# Patient Record
Sex: Female | Born: 1965 | Race: White | Hispanic: No | Marital: Single | Smoking: Current every day smoker
Health system: Southern US, Community
[De-identification: ages and names within clinical notes are randomized; demographics above are authoritative.]

## PROBLEM LIST (undated history)

## (undated) DIAGNOSIS — Z720 Tobacco use: Secondary | ICD-10-CM

## (undated) DIAGNOSIS — E7800 Pure hypercholesterolemia, unspecified: Secondary | ICD-10-CM

## (undated) DIAGNOSIS — D259 Leiomyoma of uterus, unspecified: Secondary | ICD-10-CM

## (undated) DIAGNOSIS — E785 Hyperlipidemia, unspecified: Secondary | ICD-10-CM

## (undated) DIAGNOSIS — E669 Obesity, unspecified: Secondary | ICD-10-CM

## (undated) DIAGNOSIS — R519 Headache, unspecified: Secondary | ICD-10-CM

## (undated) DIAGNOSIS — N83209 Unspecified ovarian cyst, unspecified side: Secondary | ICD-10-CM

## (undated) DIAGNOSIS — R51 Headache: Secondary | ICD-10-CM

## (undated) DIAGNOSIS — R0602 Shortness of breath: Secondary | ICD-10-CM

## (undated) DIAGNOSIS — L309 Dermatitis, unspecified: Secondary | ICD-10-CM

## (undated) DIAGNOSIS — K8020 Calculus of gallbladder without cholecystitis without obstruction: Secondary | ICD-10-CM

## (undated) HISTORY — DX: Headache, unspecified: R51.9

## (undated) HISTORY — DX: Headache: R51

## (undated) HISTORY — DX: Leiomyoma of uterus, unspecified: D25.9

## (undated) HISTORY — DX: Hyperlipidemia, unspecified: E78.5

## (undated) HISTORY — PX: OTHER SURGICAL HISTORY: SHX169

## (undated) HISTORY — DX: Tobacco use: Z72.0

## (undated) HISTORY — DX: Unspecified ovarian cyst, unspecified side: N83.209

## (undated) HISTORY — PX: CERVICAL CONE BIOPSY: SUR198

## (undated) HISTORY — DX: Obesity, unspecified: E66.9

---

## 2001-01-12 ENCOUNTER — Other Ambulatory Visit: Admission: RE | Admit: 2001-01-12 | Discharge: 2001-01-12 | Payer: Self-pay | Admitting: *Deleted

## 2002-02-02 ENCOUNTER — Other Ambulatory Visit: Admission: RE | Admit: 2002-02-02 | Discharge: 2002-02-02 | Payer: Self-pay | Admitting: *Deleted

## 2002-12-27 ENCOUNTER — Other Ambulatory Visit: Admission: RE | Admit: 2002-12-27 | Discharge: 2002-12-27 | Payer: Self-pay | Admitting: *Deleted

## 2004-02-12 ENCOUNTER — Other Ambulatory Visit: Admission: RE | Admit: 2004-02-12 | Discharge: 2004-02-12 | Payer: Self-pay | Admitting: Obstetrics and Gynecology

## 2005-03-17 ENCOUNTER — Other Ambulatory Visit: Admission: RE | Admit: 2005-03-17 | Discharge: 2005-03-17 | Payer: Self-pay | Admitting: Obstetrics and Gynecology

## 2007-06-08 ENCOUNTER — Encounter: Admission: RE | Admit: 2007-06-08 | Discharge: 2007-06-08 | Payer: Self-pay | Admitting: Obstetrics and Gynecology

## 2007-07-05 ENCOUNTER — Encounter: Admission: RE | Admit: 2007-07-05 | Discharge: 2007-07-05 | Payer: Self-pay | Admitting: Obstetrics and Gynecology

## 2008-01-18 ENCOUNTER — Encounter: Admission: RE | Admit: 2008-01-18 | Discharge: 2008-01-18 | Payer: Self-pay | Admitting: Obstetrics and Gynecology

## 2008-07-20 ENCOUNTER — Encounter: Admission: RE | Admit: 2008-07-20 | Discharge: 2008-07-20 | Payer: Self-pay | Admitting: Obstetrics and Gynecology

## 2009-07-30 ENCOUNTER — Encounter: Admission: RE | Admit: 2009-07-30 | Discharge: 2009-07-30 | Payer: Self-pay | Admitting: Obstetrics and Gynecology

## 2010-01-09 ENCOUNTER — Encounter: Payer: Self-pay | Admitting: Family Medicine

## 2010-01-09 LAB — CONVERTED CEMR LAB

## 2010-01-09 LAB — HM PAP SMEAR

## 2010-05-18 ENCOUNTER — Encounter: Payer: Self-pay | Admitting: Obstetrics and Gynecology

## 2010-06-20 ENCOUNTER — Encounter: Payer: Self-pay | Admitting: Family Medicine

## 2010-06-20 ENCOUNTER — Ambulatory Visit (INDEPENDENT_AMBULATORY_CARE_PROVIDER_SITE_OTHER): Payer: 59 | Admitting: Family Medicine

## 2010-06-20 DIAGNOSIS — F32A Depression, unspecified: Secondary | ICD-10-CM | POA: Insufficient documentation

## 2010-06-20 DIAGNOSIS — D509 Iron deficiency anemia, unspecified: Secondary | ICD-10-CM | POA: Insufficient documentation

## 2010-06-20 DIAGNOSIS — E559 Vitamin D deficiency, unspecified: Secondary | ICD-10-CM | POA: Insufficient documentation

## 2010-06-20 DIAGNOSIS — L0390 Cellulitis, unspecified: Secondary | ICD-10-CM

## 2010-06-20 DIAGNOSIS — L0291 Cutaneous abscess, unspecified: Secondary | ICD-10-CM | POA: Insufficient documentation

## 2010-06-20 DIAGNOSIS — F3289 Other specified depressive episodes: Secondary | ICD-10-CM

## 2010-06-20 DIAGNOSIS — E785 Hyperlipidemia, unspecified: Secondary | ICD-10-CM

## 2010-06-20 DIAGNOSIS — F329 Major depressive disorder, single episode, unspecified: Secondary | ICD-10-CM | POA: Insufficient documentation

## 2010-06-20 DIAGNOSIS — F17200 Nicotine dependence, unspecified, uncomplicated: Secondary | ICD-10-CM | POA: Insufficient documentation

## 2010-06-24 ENCOUNTER — Encounter: Payer: Self-pay | Admitting: Family Medicine

## 2010-06-24 NOTE — Assessment & Plan Note (Signed)
Summary: NEW PT TO EST/CLE  [employer],MAILED NPP   Vital Signs:  Patient profile:   45 year old female Height:      63 inches Weight:      219.50 pounds BMI:     39.02 Temp:     98.7 degrees F oral Pulse rate:   97 / minute Pulse rhythm:   regular BP sitting:   120 / 80  (right arm) Cuff size:   large  Vitals Entered By: Linde Gillis CMA Duncan Dull) (June 20, 2010 10:49 AM) CC: new patient, establish care   History of Present Illness: 45 yo here to establish care.  1.  Abscess on right neck- had been growing in size for several months.  Became very red and painful last month, per patient "it was squishy and the size of a quarter."  went to urgent care, given 10 day course of bactrim ds two times a day.  It is much smaller now, firm, non painful, non erythematous.  she currently has no fevers or chills.  2.  Depression- mom and dad live next door and both in failing health.  Her mom's parkinsons is now considered end stage.  [name] is having a hard time with this transition.  She is more tearful and sad.  sleeping ok, feels she eats too much.  No panic attacks.  3.  HLD- assessment at work showed elevated cholesterol.  not on medication.  Last assessment was 12/2009 and she was only given the total, not HDL/LDL/TG.  4.  Tobacco abuse- 1.5 ppd x 25 years.  quit once for a very short time, cold [place].  Not ready to quit but she works at [employer] and they are no longer aloud to smoke on campus so feels she has to quit.  Preventive Screening-Counseling & Management  Alcohol-Tobacco     Smoking Status: current  Caffeine-Diet-Exercise     Does Patient Exercise: no      Drug Use:  no.    Current Medications (verified): 1)  Sulfamethoxazole-Tmp Ds 800-160 Mg Tabs (Sulfamethoxazole-Trimethoprim) .... Take One Tablet By Mouth Two Times A Day For Ten Days 2)  Naproxen Sodium 550 Mg Tabs (Naproxen Sodium) .... Take One Tablet By Mouth Two Times A Day As Needed 3)  Multivitamins  Tabs (Multiple  Vitamin) .... Take One Tablet By Mouth Daily 4)  Folic Acid 400 Mcg Tabs (Folic Acid) .... Take One Tablet By Mouth Daily 5)  Flax Seed Oil 1000 Mg Caps (Flaxseed (Linseed)) .... Take One Tablet By Mouth Daily 6)  Calcium-Vitamin D 250-125 Mg-Unit Tabs (Calcium Carbonate-Vitamin D) .... Take One Tablet By Mouth Daily 7)  Zyban 150 Mg Xr12h-Tab (Bupropion Hcl (Smoking Deter)) .Marland KitchenMarland KitchenMarland Kitchen 150 Mg Once Daily For 3 Days; Increase To 150 Mg Twice Daily  Allergies (verified): No Known Drug Allergies  Past History:  Family History: Last updated: 06/20/2010 Family History Diabetes 1st degree relative Family History Hypertension  Social History: Last updated: 06/20/2010 Single Current Smoker Alcohol use-yes Drug use-no Regular exercise-no no children works at [employer]  Risk Factors: Exercise: no (06/20/2010)  Risk Factors: Smoking Status: current (06/20/2010)  Past Medical History: Depression Hyperlipidemia Tobacco abuse  Past History:  Care Management: OB/Gyn: Dr. Vincente Poli  Family History: Family History Diabetes 1st degree relative Family History Hypertension  Social History: Single Current Smoker Alcohol use-yes Drug use-no Regular exercise-no no children works at [employer] Smoking Status:  current Drug Use:  no Does Patient Exercise:  no  Review of Systems  See HPI General:  Denies fever. Eyes:  Denies blurring. ENT:  Denies difficulty swallowing. CV:  Denies chest pain or discomfort. Resp:  Denies shortness of breath. GI:  Denies abdominal pain and bloody stools. GU:  Denies dysuria. MS:  Denies joint pain, joint redness, and joint swelling. Derm:  Complains of lesion(s). Neuro:  Denies headaches. Psych:  Complains of depression, easily tearful, and irritability; denies anxiety, panic attacks, sense of great danger, suicidal thoughts/plans, and thoughts of violence. Endo:  Denies cold intolerance and heat intolerance. Heme:  Denies abnormal bruising and  bleeding.  Physical Exam  General:  alert and overweight-appearing.   Head:  normocephalic and atraumatic.   Eyes:  vision grossly intact, pupils equal, pupils round, and pupils reactive to light.   Ears:  R ear normal and L ear normal.   Nose:  no external deformity.   Mouth:  good dentition.   Neck:  dime dized, non flutuant, firm healing abscess on right neck, no erythema, non tender to palp. Lungs:  Normal respiratory effort, chest expands symmetrically. Lungs are clear to auscultation, no crackles or wheezes. Heart:  Normal rate and regular rhythm. S1 and S2 normal without gallop, murmur, click, rub or other extra sounds. Abdomen:  Bowel sounds positive,abdomen soft and non-tender without masses, organomegaly or hernias noted. Msk:  No deformity or scoliosis noted of thoracic or lumbar spine.   Extremities:  no edema Neurologic:  alert & oriented X3 and gait normal.   Skin:  Intact without suspicious lesions or rashes Psych:  Cognition and judgment appear intact. Alert and cooperative with normal attention span and concentration. No apparent delusions, illusions, hallucinations   Impression & Recommendations:  Problem # 1:  DEPRESSION (ICD-311) Assessment Deteriorated now worsened by home situation.   pt would like to quit smoking, discussed different antidepressants and she would like to try wellbutrin in hopes of also helping with her smoking.  Problem # 2:  TOBACCO ABUSE (ICD-305.1) Assessment: Unchanged see above. Her updated medication list for this problem includes:    Zyban 150 Mg Xr12h-tab (Bupropion hcl (smoking deter)) .Marland KitchenMarland KitchenMarland KitchenMarland Kitchen 150 mg once daily for 3 days; increase to 150 mg twice daily  Problem # 3:  ANEMIA, IRON DEFICIENCY (ICD-280.9) Assessment: Unchanged recheck CBC today. Her updated medication list for this problem includes:    Folic Acid 400 Mcg Tabs (Folic acid) .Marland Kitchen... Take one tablet by mouth daily  Orders: TLB-BMP (Basic Metabolic Panel-BMET)  (80048-METABOL) TLB-CBC Platelet - w/Differential (85025-CBCD)  Problem # 4:  HYPERLIPIDEMIA (ICD-272.4) Assessment: Unchanged fasting lipid panel today. Orders: Venipuncture (91478) TLB-Lipid Panel (80061-LIPID) TLB-Hepatic/Liver Function Pnl (80076-HEPATIC)  Problem # 5:  UNSPECIFIED VITAMIN D DEFICIENCY (ICD-268.9) Assessment: Unchanged recheck vitamin d today. Orders: T-Vitamin D (25-Hydroxy) [phone])  Problem # 6:  CELLULITIS AND ABSCESS OF UNSPECIFIED SITE (ICD-682.9) Assessment: New resolving. given rx for bactrim to fill if symptoms worsen and she will make appt to see me immediately if that were to happen. Her updated medication list for this problem includes:    Sulfamethoxazole-tmp Ds 800-160 Mg Tabs (Sulfamethoxazole-trimethoprim) .Marland Kitchen... Take one tablet by mouth two times a day for ten days  Complete Medication List: 1)  Sulfamethoxazole-tmp Ds 800-160 Mg Tabs (Sulfamethoxazole-trimethoprim) .... Take one tablet by mouth two times a day for ten days 2)  Naproxen Sodium 550 Mg Tabs (Naproxen sodium) .... Take one tablet by mouth two times a day as needed 3)  Multivitamins Tabs (Multiple vitamin) .... Take one tablet by mouth daily 4)  Folic Acid  400 Mcg Tabs (Folic acid) .... Take one tablet by mouth daily 5)  Flax Seed Oil 1000 Mg Caps (Flaxseed (linseed)) .... Take one tablet by mouth daily 6)  Calcium-vitamin D 250-125 Mg-unit Tabs (Calcium carbonate-vitamin d) .... Take one tablet by mouth daily 7)  Zyban 150 Mg Xr12h-tab (Bupropion hcl (smoking deter)) .Marland KitchenMarland KitchenMarland Kitchen 150 mg once daily for 3 days; increase to 150 mg twice daily  Patient Instructions: 1)  great to meet you. 2)  if that bump starts to grow again, please call me immediately and fill the prescription for the antibiotic. Prescriptions: SULFAMETHOXAZOLE-TMP DS 800-160 MG TABS (SULFAMETHOXAZOLE-TRIMETHOPRIM) take one tablet by mouth two times a day for ten days  #20 x 0   Entered and Authorized by:   Ruthe Mannan MD   Signed by:   Ruthe Mannan MD on 06/20/2010   Method used:   Print then Give to Patient   RxID:   [id number] ZYBAN 150 MG XR12H-TAB (BUPROPION HCL (SMOKING DETER)) 150 mg once daily for 3 days; increase to 150 mg twice daily  #60 x 3   Entered and Authorized by:   Ruthe Mannan MD   Signed by:   Ruthe Mannan MD on 06/20/2010   Method used:   Print then Give to Patient   RxID:   [id number]    Orders Added: 1)  Venipuncture [95284] 2)  TLB-Lipid Panel [80061-LIPID] 3)  TLB-BMP (Basic Metabolic Panel-BMET) [80048-METABOL] 4)  TLB-CBC Platelet - w/Differential [85025-CBCD] 5)  TLB-Hepatic/Liver Function Pnl [80076-HEPATIC] 6)  T-Vitamin D (25-Hydroxy) [13244-01027] 7)  New Patient Level IV [25366]    Current Allergies (reviewed today): No known allergies    TD Result Date:  08/10/2002 TD Result:  historical PAP Result Date:  01/09/2010 PAP Result:  historical Mammogram Result Date:  01/08/2010 Mammogram Result:  historical    Past Medical History:    Depression    Hyperlipidemia    Tobacco abuse

## 2010-06-30 ENCOUNTER — Encounter: Payer: Self-pay | Admitting: Family Medicine

## 2010-07-08 NOTE — Letter (Signed)
Summary: [facility] records  [facility] records   Imported By: Kassie Mends 07/03/2010 11:32:55  _____________________________________________________________________  External Attachment:    Type:   Image     Comment:   External Document

## 2010-07-10 ENCOUNTER — Other Ambulatory Visit: Payer: Self-pay | Admitting: Obstetrics and Gynecology

## 2010-07-10 DIAGNOSIS — Z1231 Encounter for screening mammogram for malignant neoplasm of breast: Secondary | ICD-10-CM

## 2010-07-24 NOTE — Letter (Signed)
Summary: [facility]  [facility]   Imported By: Kassie Mends 07/14/2010 10:19:28  _____________________________________________________________________  External Attachment:    Type:   Image     Comment:   External Document

## 2010-08-15 ENCOUNTER — Ambulatory Visit
Admission: RE | Admit: 2010-08-15 | Discharge: 2010-08-15 | Disposition: A | Discharge status: Home / Self Care | Payer: 59 | Source: Ambulatory Visit | Attending: Obstetrics and Gynecology | Admitting: Obstetrics and Gynecology

## 2010-08-15 DIAGNOSIS — Z1231 Encounter for screening mammogram for malignant neoplasm of breast: Secondary | ICD-10-CM

## 2010-08-25 ENCOUNTER — Other Ambulatory Visit: Payer: Self-pay | Admitting: Family Medicine

## 2010-08-25 DIAGNOSIS — E785 Hyperlipidemia, unspecified: Secondary | ICD-10-CM

## 2010-08-25 DIAGNOSIS — E559 Vitamin D deficiency, unspecified: Secondary | ICD-10-CM

## 2010-08-28 ENCOUNTER — Other Ambulatory Visit (INDEPENDENT_AMBULATORY_CARE_PROVIDER_SITE_OTHER): Payer: 59 | Admitting: Family Medicine

## 2010-08-28 DIAGNOSIS — E785 Hyperlipidemia, unspecified: Secondary | ICD-10-CM

## 2010-08-28 DIAGNOSIS — E559 Vitamin D deficiency, unspecified: Secondary | ICD-10-CM

## 2010-09-01 ENCOUNTER — Other Ambulatory Visit: Payer: Self-pay | Admitting: *Deleted

## 2010-09-01 MED ORDER — CALCIUM CITRATE-VITAMIN D 250-100 MG-UNIT PO TABS: 1.0000 | ORAL_TABLET | Freq: Every day | ORAL | Status: DC

## 2010-09-01 MED ORDER — MULTIVITAMINS PO CAPS: 1.0000 | ORAL_CAPSULE | Freq: Every day | ORAL | Status: AC

## 2010-09-01 MED ORDER — SIMVASTATIN 10 MG PO TABS: 10.0000 mg | ORAL_TABLET | Freq: Every day | ORAL | Status: DC

## 2010-09-01 MED ORDER — FLAX SEED OIL 1000 MG PO CAPS: 1.0000 | ORAL_CAPSULE | Freq: Every day | ORAL | Status: DC

## 2010-09-08 ENCOUNTER — Encounter: Payer: Self-pay | Admitting: Family Medicine

## 2010-09-10 ENCOUNTER — Other Ambulatory Visit: Payer: Self-pay | Admitting: *Deleted

## 2010-09-10 MED ORDER — SIMVASTATIN 10 MG PO TABS: 10.0000 mg | ORAL_TABLET | Freq: Every day | ORAL | Status: DC

## 2010-09-11 MED ORDER — BUPROPION HCL ER (SR) 150 MG PO TB12: 150.0000 mg | ORAL_TABLET | Freq: Two times a day (BID) | ORAL | Status: DC

## 2010-09-11 NOTE — Telephone Encounter (Signed)
Rx sent to [facility] electronically.

## 2010-09-18 ENCOUNTER — Encounter: Payer: Self-pay | Admitting: Family Medicine

## 2010-09-19 ENCOUNTER — Ambulatory Visit: Payer: 59 | Admitting: Family Medicine

## 2010-10-01 ENCOUNTER — Telehealth: Payer: Self-pay | Admitting: *Deleted

## 2010-10-01 NOTE — Telephone Encounter (Signed)
Pt states she is having a reaction to bupropion that she got from [facility]- she is getting a different generic from them and it is not as effective as the generic [facility] had [facility] before. She is staying angry all the time.   She has to deal with [facility] for insurance reasons.  Can we send in a new script for brand name only?  Please let pt know.

## 2010-10-01 NOTE — Telephone Encounter (Signed)
Yes ok to send rx 

## 2010-10-03 MED ORDER — BUPROPION HCL ER (SMOKING DET) 150 MG PO TB12: 150.0000 mg | ORAL_TABLET | Freq: Two times a day (BID) | ORAL | Status: DC

## 2010-10-03 NOTE — Telephone Encounter (Signed)
Rx sent to [facility], patient advised via message left on cell phone voicemail.

## 2010-10-07 ENCOUNTER — Telehealth: Payer: Self-pay | Admitting: *Deleted

## 2010-10-07 NOTE — Telephone Encounter (Signed)
Received fax form stating that Zyban SR is not covered under patients Rx drug plan.  We can chose an alternative or get prior authorization.  Form in your IN box.

## 2010-10-08 NOTE — Telephone Encounter (Signed)
Completed form faxed to [facility] at [phone]. 

## 2010-10-08 NOTE — Telephone Encounter (Signed)
Signed in my box

## 2010-10-30 ENCOUNTER — Telehealth: Payer: Self-pay | Admitting: *Deleted

## 2010-10-30 MED ORDER — BUPROPION HCL ER (SMOKING DET) 150 MG PO TB12: 150.0000 mg | ORAL_TABLET | Freq: Two times a day (BID) | ORAL | Status: DC

## 2010-10-30 NOTE — Telephone Encounter (Signed)
Rx sent 

## 2010-10-30 NOTE — Telephone Encounter (Signed)
Patient notified as instructed via telephone.

## 2010-10-30 NOTE — Telephone Encounter (Signed)
Pt would like brand name wellbutrin [facility] to [facility].  She says you have been working on getting her a particular generic brand but [facility] doesn't carry the one that she wants, so she wants brand name only.

## 2010-12-10 ENCOUNTER — Other Ambulatory Visit: Payer: Self-pay | Admitting: *Deleted

## 2010-12-10 MED ORDER — BUPROPION HCL ER (SR) 150 MG PO TB12: 150.0000 mg | ORAL_TABLET | Freq: Two times a day (BID) | ORAL | Status: DC

## 2010-12-10 NOTE — Telephone Encounter (Signed)
Last filled 11/11/10.

## 2010-12-11 NOTE — Telephone Encounter (Signed)
Quanitity on wellbutrin refill changed to #60 and called to [facility] [address]

## 2011-05-04 ENCOUNTER — Other Ambulatory Visit: Payer: Self-pay | Admitting: *Deleted

## 2011-05-04 MED ORDER — BUPROPION HCL ER (SR) 150 MG PO TB12: 150.0000 mg | ORAL_TABLET | Freq: Two times a day (BID) | ORAL | Status: DC

## 2011-05-04 NOTE — Telephone Encounter (Signed)
Last refill 03/26/2011.

## 2011-05-13 ENCOUNTER — Other Ambulatory Visit: Payer: Self-pay | Admitting: Internal Medicine

## 2011-08-11 ENCOUNTER — Other Ambulatory Visit: Payer: Self-pay | Admitting: Family Medicine

## 2011-08-11 ENCOUNTER — Encounter: Payer: Self-pay | Admitting: Family Medicine

## 2011-08-11 ENCOUNTER — Ambulatory Visit (INDEPENDENT_AMBULATORY_CARE_PROVIDER_SITE_OTHER): Payer: 59 | Admitting: Family Medicine

## 2011-08-11 VITALS — BP 106/62 | HR 88 | Temp 98.1°F | Wt 237.0 lb

## 2011-08-11 DIAGNOSIS — L723 Sebaceous cyst: Secondary | ICD-10-CM

## 2011-08-11 DIAGNOSIS — L089 Local infection of the skin and subcutaneous tissue, unspecified: Secondary | ICD-10-CM

## 2011-08-11 DIAGNOSIS — L729 Follicular cyst of the skin and subcutaneous tissue, unspecified: Secondary | ICD-10-CM | POA: Insufficient documentation

## 2011-08-11 MED ORDER — SULFAMETHOXAZOLE-TRIMETHOPRIM 800-160 MG PO TABS: 1.0000 | ORAL_TABLET | Freq: Two times a day (BID) | ORAL | Status: AC

## 2011-08-11 MED ORDER — PRAVASTATIN SODIUM 10 MG PO TABS: 40.0000 mg | ORAL_TABLET | Freq: Every day | ORAL | Status: DC

## 2011-08-11 NOTE — Progress Notes (Signed)
46 yo here for   Abscess on right face/neck- recurrent issue. Started to get larger and more painful with surrounding erythema few days ago. Has not yet applied warm compresses.  No fevers or chills. No n/v/d.  Patient Active Problem List  Diagnoses  . UNSPECIFIED VITAMIN D DEFICIENCY  . HYPERLIPIDEMIA  . ANEMIA, IRON DEFICIENCY  . TOBACCO ABUSE  . DEPRESSION  . CELLULITIS AND ABSCESS OF UNSPECIFIED SITE  . Infected cyst of skin   Past Medical History  Diagnosis Date  . Depression   . Hyperlipidemia   . Tobacco abuse    No past surgical history on file. History  Substance Use Topics  . Smoking status: Current Everyday Smoker  . Smokeless tobacco: Not on file  . Alcohol Use: Yes   No family history on file. No Known Allergies Current Outpatient Prescriptions on File Prior to Visit  Medication Sig Dispense Refill  . buPROPion (WELLBUTRIN SR) 150 MG 12 hr tablet Take 1 tablet (150 mg total) by mouth 2 (two) times daily.  120 tablet  3  . calcium-vitamin D (OSCAL) 250-125 MG-UNIT per tablet Take 1 tablet by mouth daily.        . Flaxseed, Linseed, (FLAX SEED OIL) 1000 MG CAPS Take 1 capsule (1,000 mg total) by mouth daily.  30 capsule  0  . folic acid (FOLVITE) 400 MCG tablet Take 400 mcg by mouth daily.        . Multiple Vitamin (MULTIVITAMIN) capsule Take 1 capsule by mouth daily.  30 capsule  0  . naproxen sodium (ANAPROX) 550 MG tablet Take 550 mg by mouth 2 (two) times daily as needed.        . pravastatin (PRAVACHOL) 10 MG tablet Take 4 tablets (40 mg total) by mouth daily.  90 tablet  3   The PMH, PSH, Social History, Family History, Medications, and allergies have been reviewed in [facility], and have been updated if relevant.  Review of Systems  See HPI  General: Denies fever.   Physical Exam  BP 106/62  Pulse 88  Temp(Src) 98.1 F (36.7 C) (Oral)  Wt 237 lb (107.502 kg)  General: alert and overweight-appearing.  Mouth: good dentition.  Skin: dime dized, non  flutuant, firm tender abscess on right side of face.   Psych: Cognition and judgment appear intact. Alert and cooperative with normal attention span and concentration. No apparent delusions, illusions, hallucinations   Assessment and Plan: 1. Infected cyst of skin  Ambulatory referral to General Surgery   Deteriorated. Will place on Bactrim and refer to surg for removal. The patient indicates understanding of these issues and agrees with the plan.

## 2011-08-11 NOTE — Patient Instructions (Signed)
Please take Bactrim as directed- 1 tablet by mouth twice daily x 10 days. Please start using warm compresses again. Stop by to see Shirlee Limerick on your way out to set up your appointment the surgeon.

## 2011-11-24 ENCOUNTER — Other Ambulatory Visit: Payer: Self-pay | Admitting: Obstetrics and Gynecology

## 2011-11-24 DIAGNOSIS — Z1231 Encounter for screening mammogram for malignant neoplasm of breast: Secondary | ICD-10-CM

## 2011-12-17 ENCOUNTER — Ambulatory Visit
Admission: RE | Admit: 2011-12-17 | Discharge: 2011-12-17 | Disposition: A | Discharge status: Home / Self Care | Payer: 59 | Source: Ambulatory Visit | Attending: Obstetrics and Gynecology | Admitting: Obstetrics and Gynecology

## 2011-12-17 DIAGNOSIS — Z1231 Encounter for screening mammogram for malignant neoplasm of breast: Secondary | ICD-10-CM

## 2011-12-21 ENCOUNTER — Other Ambulatory Visit: Payer: Self-pay | Admitting: Obstetrics and Gynecology

## 2011-12-21 DIAGNOSIS — R928 Other abnormal and inconclusive findings on diagnostic imaging of breast: Secondary | ICD-10-CM

## 2011-12-23 ENCOUNTER — Ambulatory Visit
Admission: RE | Admit: 2011-12-23 | Discharge: 2011-12-23 | Disposition: A | Discharge status: Home / Self Care | Payer: 59 | Source: Ambulatory Visit | Attending: Obstetrics and Gynecology | Admitting: Obstetrics and Gynecology

## 2011-12-23 DIAGNOSIS — R928 Other abnormal and inconclusive findings on diagnostic imaging of breast: Secondary | ICD-10-CM

## 2012-01-01 ENCOUNTER — Ambulatory Visit (INDEPENDENT_AMBULATORY_CARE_PROVIDER_SITE_OTHER): Payer: 59 | Admitting: Family Medicine

## 2012-01-01 ENCOUNTER — Encounter: Payer: Self-pay | Admitting: Family Medicine

## 2012-01-01 VITALS — BP 126/62 | HR 100 | Temp 98.1°F | Wt 237.0 lb

## 2012-01-01 DIAGNOSIS — R1110 Vomiting, unspecified: Secondary | ICD-10-CM

## 2012-01-01 MED ORDER — ONDANSETRON HCL 4 MG PO TABS: 4.0000 mg | ORAL_TABLET | Freq: Three times a day (TID) | ORAL | Status: AC | PRN

## 2012-01-01 NOTE — Patient Instructions (Addendum)
Drink sips of fluids and take zofran for nausea.  If you have aches, it is okay to take naproxen with food.  Out of work in meantime.  I think you have stomach virus. Wash your hands frequently.

## 2012-01-01 NOTE — Progress Notes (Signed)
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  She felt hot, sweaty and light headed.  Went to BR and vomited, had diarrhea.  Then had chills.  Since last night, has had HA.  Took some naproxen and tylenol this AM with relief of HA.  Aches are resolved in interval.  No fevers known/documented.  No blood in stool or vomit.  Her taste is altered from baseline.  Was able to eat some soup last night.    Meds, vitals, and allergies reviewed.   ROS: See HPI.  Otherwise, noncontributory.  GEN: nad, alert and oriented HEENT: mucous membranes moist, TM wnl, nasal and OP exam wnl NECK: supple w/o LA CV: rrr. PULM: ctab, no inc wob ABD: soft, +bs, not ttp EXT: no edema SKIN: no acute rash

## 2012-01-03 DIAGNOSIS — R1110 Vomiting, unspecified: Secondary | ICD-10-CM | POA: Insufficient documentation

## 2012-01-03 NOTE — Assessment & Plan Note (Signed)
Likely AGE with  benign exam.  Nontoxic, doesn't appear dehydrated and okay for outpatient f/u with supportive tx in meantime.  She agrees.

## 2012-01-04 ENCOUNTER — Telehealth: Payer: Self-pay | Admitting: Family Medicine

## 2012-01-04 NOTE — Telephone Encounter (Signed)
Triage Record Num: [id number] Operator: Lyn Hollingshead Patient Name: [name] Call Date & Time: 01/02/2012 12:00:58PM Patient Phone: [phone] PCP: Crawford Givens Patient Gender: Female PCP Fax : Patient DOB: [birth year] [facility] Name: [facility] Reason for Call: Caller: Pam/Patient; PCP: Crawford Givens Clelia Croft) [facility]); CB#: [id number]; Call regarding Was told to let Dr Know of any changes and now she has lost control of bladder,. ; Onset- 01/02/12 Pt has not taken her temperature but does not think she has fever. Recommended she take temperature. Pt is having urinary incontinence all of sudden starting yesterday evening. She states every time she walks, turns over in bed, etc. urine comes out. She also has some low back pain. She can also see pink on the tissue with wiping. She is not sure if it is coming from her urine or her vagina. Emergent s/s of Urinary symptoms protocol r/o. Pt to see provider within 4hrs. Protocol(s) Used: Urinary Symptoms - Female Recommended Outcome per Protocol: See Provider within 4 hours Reason for Outcome: Urinary tract symptoms AND any flank or low back pain Care Advice: Increase intake of fluids. Try to drink 8 oz. (.2 liter) every hour when awake, including unsweetened cranberry juice, unless on restricted fluids for other medical reasons. Take sips of fluid or eat ice chips if nauseated or vomiting. ~ Limit carbonated, alcoholic, and caffeinated beverages such as coffee, tea and soda. Avoid nonprescription cold and allergy medications that contain caffeine. Limit intake of tomatoes, fruit juices (except for unsweetened cranberry juice), dairy products, spicy foods, sugar, and artificial sweeteners (aspartame or saccharine). Stop or decrease smoking. Reducing exposure to bladder irritants may help lessen urgency. ~ 01/02/2012 12:18:54PM Page 1 of 1 [id number]

## 2012-01-05 NOTE — Telephone Encounter (Signed)
Called patient, she saw gyn yesterday and has developed several fibroids- one is almost grapefruit size- and she had a watery cyst in her fallopian tube that had burst, which is where the fluid was coming from.  Gyn put her on antibiotic for 14 days and then she is to go back for repeat ultrasound.

## 2012-01-05 NOTE — Telephone Encounter (Signed)
Advised pt to call if she needs anything.

## 2012-01-05 NOTE — Telephone Encounter (Signed)
Thanks for the update.  Please continue to keep [place] posted.

## 2012-01-07 ENCOUNTER — Encounter: Payer: Self-pay | Admitting: *Deleted

## 2012-01-07 ENCOUNTER — Telehealth: Payer: Self-pay

## 2012-01-07 NOTE — Telephone Encounter (Signed)
Pt seen 01/03/12; pt returned to work today and needs note from doctor OK to return to work; pt has no h/a, N or V, diarrhea or dizziness.Please advise.

## 2012-01-07 NOTE — Telephone Encounter (Signed)
Patient requested that note be faxed to [phone] to her at her work place.  Faxed.

## 2012-01-07 NOTE — Telephone Encounter (Signed)
Please give her a note to return to work.

## 2012-01-07 NOTE — Telephone Encounter (Signed)
Printed.  Patient advised.

## 2012-01-21 ENCOUNTER — Encounter: Payer: Self-pay | Admitting: Gastroenterology

## 2012-02-03 ENCOUNTER — Ambulatory Visit (INDEPENDENT_AMBULATORY_CARE_PROVIDER_SITE_OTHER): Payer: 59 | Admitting: Gastroenterology

## 2012-02-03 ENCOUNTER — Encounter: Payer: Self-pay | Admitting: Gastroenterology

## 2012-02-03 VITALS — BP 110/64 | HR 88 | Ht 62.25 in | Wt 237.5 lb

## 2012-02-03 DIAGNOSIS — R110 Nausea: Secondary | ICD-10-CM

## 2012-02-03 DIAGNOSIS — R194 Change in bowel habit: Secondary | ICD-10-CM

## 2012-02-03 DIAGNOSIS — K5900 Constipation, unspecified: Secondary | ICD-10-CM

## 2012-02-03 DIAGNOSIS — K5909 Other constipation: Secondary | ICD-10-CM

## 2012-02-03 DIAGNOSIS — R198 Other specified symptoms and signs involving the digestive system and abdomen: Secondary | ICD-10-CM

## 2012-02-03 MED ORDER — OMEPRAZOLE 40 MG PO CPDR: 20.0000 mg | DELAYED_RELEASE_CAPSULE | Freq: Every day | ORAL | Status: DC

## 2012-02-03 MED ORDER — PEG-KCL-NACL-NASULF-NA ASC-C 100 G PO SOLR: 1.0000 | Freq: Once | ORAL | Status: DC

## 2012-02-03 NOTE — Patient Instructions (Addendum)
One of your biggest health concerns is your smoking.  This increases your risk for most cancers and serious cardiovascular diseases such as strokes, heart attacks.  You should try your best to stop.  If you need assistance, please contact your PCP or Smoking Cessation Class at [facility] [phone]) or [facility] Quit-Line (1-800-QUIT-NOW). You will be set up for an upper endoscopy for post prandial pains, nausea. If this is negative, then [place] of gallbladder. You will be set up for a colonoscopy for recent bowel changes, chronic constipation (lec, moderate sedation). Samples of PPI given, antiacid medcine.  Take one pill, once daily 20-30 minutes before breakfast meal. A copy of this information will be made available to Dr. Vincente Poli.

## 2012-02-03 NOTE — Progress Notes (Signed)
HPI: This is a     very pleasant 46 year old woman whom I am meeting for the first time today.  Has been having hot flashes (very warm on face, arms).  Headaches, nausea, back aches, pin-needles, light headed.  Has intermittent nausea with hot flashes only.  This started in August, 3 times in September, 3-4 minor ones more recently.  She can only eat 3-6 bites and then has sharp pressure in epigastrium.  This occurs with really any foods but she avoided greasy fooods, spicey foods.  Can only eat small meals, will be uncomfortable for several hours.  Has not been losing weight at all.  Takes napraoxen during menstral cycle (4 times a month).  Does not take daily. Will take alleve as well.  Takes tylenol occasionally.  Gets pyrosis.  Has not tried any antiacid meds.  Bowel are "back to normal," usually a bit constipated. Never sees blood in her stools.  She did have very loose runny stools in past few months  Has been on abx recently for unclear infection, cipro; "her uterus was very swollen, tubes inflammed"  Never had EGD or colonoscopy.  No colon  Cancer in family.    Review of systems: Pertinent positive and negative review of systems were noted in the above HPI section. Complete review of systems was performed and was otherwise normal.    Past Medical History  Diagnosis Date  . Depression     pt denies  . Hyperlipidemia   . Tobacco abuse   . Generalized headaches   . Skin cancer   . Obesity     Past Surgical History  Procedure Date  . Endometrial surgery     Current Outpatient Prescriptions  Medication Sig Dispense Refill  . calcium-vitamin D (OSCAL) 250-125 MG-UNIT per tablet Take 1 tablet by mouth daily.        . Multiple Vitamins-Minerals (MULTIVITAMIN PO) Take 1 tablet by mouth daily.      . naproxen sodium (ANAPROX) 550 MG tablet Take 550 mg by mouth 2 (two) times daily as needed.        Marland Kitchen buPROPion (WELLBUTRIN SR) 150 MG 12 hr tablet Take 150 mg by mouth daily.        . Flaxseed, Linseed, (FLAX SEED OIL) 1000 MG CAPS Take 1 capsule (1,000 mg total) by mouth daily.  30 capsule  0  . folic acid (FOLVITE) 400 MCG tablet Take 400 mcg by mouth daily.        . pravastatin (PRAVACHOL) 10 MG tablet Take 10 mg by mouth daily.        Allergies as of 02/03/2012  . (No Known Allergies)    Family History  Problem Relation Age of Onset  . Heart disease Father   . Arthritis Mother   . Diabetes Father   . Skin cancer Father   . Diabetes Mother   . Diabetes Paternal Grandmother     History   Social History  . Marital Status: Single    Spouse Name: N/A    Number of Children: 0  . Years of Education: N/A   Occupational History  . clerical [employer]   Social History Main Topics  . Smoking status: Current Every Day Smoker -- 1.0 packs/day for 30 years    Types: Cigarettes  . Smokeless tobacco: Never Used  . Alcohol Use: Yes     1 beer once a year  . Drug Use: No  . Sexually Active: Not on file   Other  Topics Concern  . Not on file   Social History Narrative  . No narrative on file       Physical Exam: BP 110/64  Pulse 88  Ht 5' 2.25" (1.581 m)  Wt 237 lb 8 oz (107.729 kg)  BMI 43.09 kg/m2  LMP 02/01/2012 Constitutional: generally well-appearing Psychiatric: alert and oriented x3 Eyes: extraocular movements intact Mouth: oral pharynx moist, no lesions Neck: supple no lymphadenopathy Cardiovascular: heart regular rate and rhythm Lungs: clear to auscultation bilaterally Abdomen: soft, nontender, nondistended, no obvious ascites, no peritoneal signs, normal bowel sounds Extremities: no lower extremity edema bilaterally Skin: no lesions on visible extremities    Assessment and plan: 46 y.o. female with  postprandial epigastric pain, intermittent GERD, chronic constipation, recent change in bowel habits.  Her upper GI symptoms may be GERD related, perhaps due to some of her and said use although it is not very extreme. I am giving  her samples of an antiacid medicine we will proceed with EGD at her soonest convenience. If the EGD is unrevealing then I will turn attention to her gallbladder by ordering an abdominal ultrasound. She is certain the right demographic patient to get gallstones, gallstone disease. She has had recent changes in her bowels although they're improving, she still has her usual constipation. We'll proceed with colonoscopy at the same time as her upper endoscopy.

## 2012-02-22 ENCOUNTER — Ambulatory Visit (AMBULATORY_SURGERY_CENTER): Payer: 59 | Admitting: Gastroenterology

## 2012-02-22 ENCOUNTER — Encounter: Payer: Self-pay | Admitting: Gastroenterology

## 2012-02-22 VITALS — BP 124/84 | HR 92 | Temp 98.8°F | Resp 14 | Ht 62.0 in | Wt 237.0 lb

## 2012-02-22 DIAGNOSIS — K2960 Other gastritis without bleeding: Secondary | ICD-10-CM

## 2012-02-22 DIAGNOSIS — R198 Other specified symptoms and signs involving the digestive system and abdomen: Secondary | ICD-10-CM

## 2012-02-22 DIAGNOSIS — R110 Nausea: Secondary | ICD-10-CM

## 2012-02-22 DIAGNOSIS — K5900 Constipation, unspecified: Secondary | ICD-10-CM

## 2012-02-22 MED ORDER — SODIUM CHLORIDE 0.9 % IV SOLN: 500.0000 mL | INTRAVENOUS | Status: DC

## 2012-02-22 MED ORDER — FLEET ENEMA 7-19 GM/118ML RE ENEM
1.0000 | ENEMA | Freq: Once | RECTAL | Status: AC
  Administered 2012-02-22: 1 via RECTAL

## 2012-02-22 NOTE — Op Note (Signed)
[facility] [address] [place], [address]   ENDOSCOPY PROCEDURE REPORT  PATIENT: Jade, Zuniga  MR#: [id number] BIRTHDATE: 29-Apr-1965 , 46  yrs. old GENDER: Female ENDOSCOPIST: Rachael Fee, MD PROCEDURE DATE:  02/22/2012 PROCEDURE:  EGD w/ biopsy ASA CLASS:     Class III INDICATIONS:  post prandial epig pain, GERD. MEDICATIONS: There was residual sedation effect present from prior procedure, Fentanyl 25 mcg IV, Versed 2 mg IV, and These medications were titrated to patient response per physician's verbal order TOPICAL ANESTHETIC: Cetacaine Spray  DESCRIPTION OF PROCEDURE: After the risks benefits and alternatives of the procedure were thoroughly explained, informed consent was obtained.  The LB [id number]-H180 [id number] endoscope was introduced through the mouth and advanced to the second portion of the duodenum. Without limitations.  The instrument was slowly withdrawn as the mucosa was fully examined.  There was mild, non-specific gastritis.  This was biopsied and sent to pathology.  The examination was otherwise normal.  Retroflexed views revealed no abnormalities.     The scope was then withdrawn from the patient and the procedure completed. COMPLICATIONS: There were no complications.  ENDOSCOPIC IMPRESSION: There was mild, non-specific gastritis.  This was biopsied and sent to pathology. The examination was otherwise normal.  RECOMMENDATIONS: If biopsies show H.  pylori, she will be treated with appropriate antibiotics.  If not, then will turn attention to gallbladder as possible cause of post prandial pains.    eSigned:  Rachael Fee, MD 02/22/2012 3:22 PM   CC: Ruthe Mannan, MD

## 2012-02-22 NOTE — Progress Notes (Signed)
Pt reports stools are still cloudy brown with particulate matter. Dr. Christella Hartigan ordered a fleets enema. 1 administered by patient to self, observed results clear with a small amount of yellow particulate matter. Dr. Christella Hartigan aware and will proceed with colonoscopy.

## 2012-02-22 NOTE — Progress Notes (Signed)
Patient did not experience any of the following events: a burn prior to discharge; a fall within the facility; wrong site/side/patient/procedure/implant event; or a hospital transfer or hospital admission upon discharge from the facility. (G8907) Patient did not have preoperative order for IV antibiotic SSI prophylaxis. (G8918)  

## 2012-02-22 NOTE — Patient Instructions (Addendum)
One of your biggest health concerns is your smoking.  This increases your risk for most cancers and serious cardiovascular diseases such as strokes, heart attacks.  You should try your best to stop.  If you need assistance, please contact your PCP or Smoking Cessation Class at [facility] [phone]) or [facility] Quit-Line (1-800-QUIT-NOW). Discharge instructions given with verbal understanding. Handout on gastritis given. Resume previous medications. YOU HAD AN ENDOSCOPIC PROCEDURE TODAY AT THE  ENDOSCOPY CENTER: Refer to the procedure report that was given to you for any specific questions about what was found during the examination.  If the procedure report does not answer your questions, please call your gastroenterologist to clarify.  If you requested that your care partner not be given the details of your procedure findings, then the procedure report has been included in a sealed envelope for you to review at your convenience later.  YOU SHOULD EXPECT: Some feelings of bloating in the abdomen. Passage of more gas than usual.  Walking can help get rid of the air that was put into your GI tract during the procedure and reduce the bloating. If you had a lower endoscopy (such as a colonoscopy or flexible sigmoidoscopy) you may notice spotting of blood in your stool or on the toilet paper. If you underwent a bowel prep for your procedure, then you may not have a normal bowel movement for a few days.  DIET: Your first meal following the procedure should be a light meal and then it is ok to progress to your normal diet.  A half-sandwich or bowl of soup is an example of a good first meal.  Heavy or fried foods are harder to digest and may make you feel nauseous or bloated.  Likewise meals heavy in dairy and vegetables can cause extra gas to form and this can also increase the bloating.  Drink plenty of fluids but you should avoid alcoholic beverages for 24 hours.  ACTIVITY: Your care partner  should take you home directly after the procedure.  You should plan to take it easy, moving slowly for the rest of the day.  You can resume normal activity the day after the procedure however you should NOT DRIVE or use heavy machinery for 24 hours (because of the sedation medicines used during the test).    SYMPTOMS TO REPORT IMMEDIATELY: A gastroenterologist can be reached at any hour.  During normal business hours, 8:30 AM to 5:00 PM Monday through Friday, call [phone].  After hours and on weekends, please call the GI answering service at [phone] who will take a message and have the physician on call contact you.   Following lower endoscopy (colonoscopy or flexible sigmoidoscopy):  Excessive amounts of blood in the stool  Significant tenderness or worsening of abdominal pains  Swelling of the abdomen that is new, acute  Fever of 100F or higher  Following upper endoscopy (EGD)  Vomiting of blood or coffee ground material  New chest pain or pain under the shoulder blades  Painful or persistently difficult swallowing  New shortness of breath  Fever of 100F or higher  Black, tarry-looking stools  FOLLOW UP: If any biopsies were taken you will be contacted by phone or by letter within the next 1-3 weeks.  Call your gastroenterologist if you have not heard about the biopsies in 3 weeks.  Our staff will call the home number listed on your records the next business day following your procedure to check on you and address any  questions or concerns that you may have at that time regarding the information given to you following your procedure. This is a courtesy call and so if there is no answer at the home number and we have not heard from you through the emergency physician on call, we will assume that you have returned to your regular daily activities without incident.  SIGNATURES/CONFIDENTIALITY: You and/or your care partner have signed paperwork which will be entered into your  electronic medical record.  These signatures attest to the fact that that the information above on your After Visit Summary has been reviewed and is understood.  Full responsibility of the confidentiality of this discharge information lies with you and/or your care-partner.

## 2012-02-22 NOTE — Op Note (Signed)
[facility] [address] [place], [address]   COLONOSCOPY PROCEDURE REPORT  PATIENT: Jade, Zuniga  MR#: [id number] BIRTHDATE: [birth year] , 46  yrs. old GENDER: Female ENDOSCOPIST: Rachael Fee, MD REFERRED XB:JYNWG Aron, M.D. PROCEDURE DATE:  02/22/2012 PROCEDURE:   Colonoscopy, diagnostic ASA CLASS:   Class II INDICATIONS:constipation. MEDICATIONS: Fentanyl 75 mcg IV, Versed 8 mg IV, and These medications were titrated to patient response per physician's verbal order  DESCRIPTION OF PROCEDURE:   After the risks benefits and alternatives of the procedure were thoroughly explained, informed consent was obtained.  A digital rectal exam revealed no abnormalities of the rectum.   The LB [id number]-[id number]  endoscope was introduced through the anus and advanced to the cecum, which was identified by both the appendix and ileocecal valve. No adverse events experienced.   The quality of the prep was adequate, using MoviPrep  The instrument was then slowly withdrawn as the colon was fully examined.   COLON FINDINGS: A normal appearing cecum, ileocecal valve, and appendiceal orifice were identified.  The ascending, hepatic flexure, transverse, splenic flexure, descending, sigmoid colon and rectum appeared unremarkable.  No polyps or cancers were seen. Retroflexed views revealed no abnormalities. The time to cecum=5 minutes 12 seconds.  Withdrawal time=9 minutes 20 seconds.  The scope was withdrawn and the procedure completed. COMPLICATIONS: There were no complications.  ENDOSCOPIC IMPRESSION: Normal colon No polyps or cancers  RECOMMENDATIONS: You should continue to follow colorectal cancer screening guidelines for "routine risk" patients with a repeat colonoscopy in 10 years. There is no need for FOBT (stool) testing for at least 5 years.    eSigned:  Rachael Fee, MD 02/22/2012 3:10 PM

## 2012-02-23 ENCOUNTER — Telehealth: Payer: Self-pay | Admitting: *Deleted

## 2012-02-23 NOTE — Telephone Encounter (Signed)
  Follow up Call-  Call back number 02/22/2012  Post procedure Call Back phone  # [phone]  Permission to leave phone message Yes     Patient questions:  Do you have a fever, pain , or abdominal swelling? no Pain Score  0 *  Have you tolerated food without any problems? yes  Have you been able to return to your normal activities? yes  Do you have any questions about your discharge instructions: Diet   no Medications  no Follow up visit  no  Do you have questions or concerns about your Care? no  Actions: * If pain score is 4 or above: No action needed, pain <4.

## 2012-02-24 ENCOUNTER — Telehealth: Payer: Self-pay | Admitting: Gastroenterology

## 2012-02-24 NOTE — Telephone Encounter (Signed)
Pt notified that Dr Christella Hartigan is waiting for her pathology and if negative he will then focus on her gallbladder,  I did advise pt I will call her as soon as results are available

## 2012-02-29 ENCOUNTER — Other Ambulatory Visit: Payer: Self-pay

## 2012-02-29 DIAGNOSIS — R1013 Epigastric pain: Secondary | ICD-10-CM

## 2012-02-29 NOTE — Progress Notes (Signed)
Pt has been scheduled for an [place] on 03/02/12 10 am [facility] pt has been instructed and meds reviewed

## 2012-03-02 ENCOUNTER — Ambulatory Visit (HOSPITAL_COMMUNITY)
Admission: RE | Admit: 2012-03-02 | Discharge: 2012-03-02 | Disposition: A | Discharge status: Home / Self Care | Payer: 59 | Source: Ambulatory Visit | Attending: Gastroenterology | Admitting: Gastroenterology

## 2012-03-02 DIAGNOSIS — R1013 Epigastric pain: Secondary | ICD-10-CM | POA: Insufficient documentation

## 2012-03-03 ENCOUNTER — Encounter (HOSPITAL_COMMUNITY): Payer: Self-pay | Admitting: *Deleted

## 2012-03-03 ENCOUNTER — Emergency Department (HOSPITAL_COMMUNITY)
Admission: EM | Admit: 2012-03-03 | Discharge: 2012-03-04 | Disposition: A | Discharge status: Home / Self Care | Payer: 59 | Attending: Emergency Medicine | Admitting: Emergency Medicine

## 2012-03-03 DIAGNOSIS — F329 Major depressive disorder, single episode, unspecified: Secondary | ICD-10-CM | POA: Insufficient documentation

## 2012-03-03 DIAGNOSIS — F3289 Other specified depressive episodes: Secondary | ICD-10-CM | POA: Insufficient documentation

## 2012-03-03 DIAGNOSIS — E669 Obesity, unspecified: Secondary | ICD-10-CM | POA: Insufficient documentation

## 2012-03-03 DIAGNOSIS — Z7982 Long term (current) use of aspirin: Secondary | ICD-10-CM | POA: Insufficient documentation

## 2012-03-03 DIAGNOSIS — R0602 Shortness of breath: Secondary | ICD-10-CM | POA: Insufficient documentation

## 2012-03-03 DIAGNOSIS — R079 Chest pain, unspecified: Secondary | ICD-10-CM

## 2012-03-03 DIAGNOSIS — R42 Dizziness and giddiness: Secondary | ICD-10-CM | POA: Insufficient documentation

## 2012-03-03 DIAGNOSIS — R072 Precordial pain: Secondary | ICD-10-CM | POA: Insufficient documentation

## 2012-03-03 DIAGNOSIS — E785 Hyperlipidemia, unspecified: Secondary | ICD-10-CM | POA: Insufficient documentation

## 2012-03-03 DIAGNOSIS — R110 Nausea: Secondary | ICD-10-CM | POA: Insufficient documentation

## 2012-03-03 DIAGNOSIS — Z8742 Personal history of other diseases of the female genital tract: Secondary | ICD-10-CM | POA: Insufficient documentation

## 2012-03-03 DIAGNOSIS — F17200 Nicotine dependence, unspecified, uncomplicated: Secondary | ICD-10-CM | POA: Insufficient documentation

## 2012-03-03 DIAGNOSIS — E7800 Pure hypercholesterolemia, unspecified: Secondary | ICD-10-CM | POA: Insufficient documentation

## 2012-03-03 HISTORY — DX: Pure hypercholesterolemia, unspecified: E78.00

## 2012-03-03 LAB — URINALYSIS, MICROSCOPIC ONLY
Bilirubin Urine: NEGATIVE
Ketones, ur: NEGATIVE mg/dL
Nitrite: NEGATIVE
Urobilinogen, UA: 1 mg/dL (ref 0.0–1.0)

## 2012-03-03 LAB — COMPREHENSIVE METABOLIC PANEL
Alkaline Phosphatase: 86 U/L (ref 39–117)
BUN: 15 mg/dL (ref 6–23)
Creatinine, Ser: 0.68 mg/dL (ref 0.50–1.10)
GFR calc Af Amer: >90 mL/min (ref >90)
Glucose, Bld: 160 mg/dL — ABNORMAL HIGH (ref 70–99)
Potassium: 3.9 mEq/L (ref 3.5–5.1)
Total Bilirubin: 0.2 mg/dL — ABNORMAL LOW (ref 0.3–1.2)
Total Protein: 7.1 g/dL (ref 6.0–8.3)

## 2012-03-03 LAB — CBC WITH DIFFERENTIAL/PLATELET
Eosinophils Absolute: 0 10*3/uL (ref 0.0–0.7)
HCT: 47 % — ABNORMAL HIGH (ref 36.0–46.0)
Hemoglobin: 15.7 g/dL — ABNORMAL HIGH (ref 12.0–15.0)
Lymphs Abs: 1.5 10*3/uL (ref 0.7–4.0)
MCH: 30.3 pg (ref 26.0–34.0)
MCV: 90.6 fL (ref 78.0–100.0)
Monocytes Absolute: 0.9 10*3/uL (ref 0.1–1.0)
Monocytes Relative: 4 % (ref 3–12)
Neutrophils Relative %: 88 % — ABNORMAL HIGH (ref 43–77)
RBC: 5.19 MIL/uL — ABNORMAL HIGH (ref 3.87–5.11)

## 2012-03-03 LAB — LIPASE, BLOOD: Lipase: 30 U/L (ref 11–59)

## 2012-03-03 LAB — POCT PREGNANCY, URINE: Preg Test, Ur: NEGATIVE

## 2012-03-03 MED ORDER — ASPIRIN 81 MG PO CHEW
324.0000 mg | CHEWABLE_TABLET | Freq: Once | ORAL | Status: AC
  Administered 2012-03-04: 324 mg via ORAL
  Filled 2012-03-03: qty 4

## 2012-03-03 MED ORDER — PANTOPRAZOLE SODIUM 40 MG IV SOLR
40.0000 mg | Freq: Once | INTRAVENOUS | Status: AC
  Administered 2012-03-04: 40 mg via INTRAVENOUS
  Filled 2012-03-03: qty 40

## 2012-03-03 MED ORDER — FAMOTIDINE 20 MG PO TABS
20.0000 mg | ORAL_TABLET | Freq: Once | ORAL | Status: AC
  Administered 2012-03-04: 20 mg via ORAL
  Filled 2012-03-03: qty 1

## 2012-03-03 MED ORDER — GI COCKTAIL ~~LOC~~
30.0000 mL | Freq: Once | ORAL | Status: AC
  Administered 2012-03-04: 30 mL via ORAL
  Filled 2012-03-03: qty 30

## 2012-03-03 NOTE — ED Provider Notes (Signed)
History     CSN: [id number]  Arrival date & time 03/03/12  2039   None     Chief Complaint  Patient presents with  . Abdominal Pain    (Consider location/radiation/quality/duration/timing/severity/associated sxs/prior treatment) HPI History provided by pt.   Pt has had intermittent episodes of non-pleuritic, throbbing substernal CP for the past several months.  Had a typical episode this evening while lying on couch at 6:30pm.  First she gets a hot flash and becomes clammy, then develops constant CP that radiates through to her back w/ associated SOB, nausea and lightheadedness.  These sx are never exertional and always seem to start after eating.  Has been evaluated by GI, had an unimpressive endoscopy, and then had an [place] to evaluate her gallbladder yesterday.  Per prior chart, US showed possible biliary sludge.  Pertinent PMH includes hyperlipidemia and smoking.  Father had an MI at age 31.  Currently has mild substernal pressure and becomes lightheaded if she stands.  Has taken 1 po aspirin.    Past Medical History  Diagnosis Date  . Depression     pt denies  . Hyperlipidemia   . Tobacco abuse   . Generalized headaches   . Obesity   . Uterine fibroid   . Ovarian cyst   . Hypercholesteremia     Past Surgical History  Procedure Date  . Endometrial surgery     Family History  Problem Relation Age of Onset  . Heart disease Father   . Diabetes Father   . Skin cancer Father   . Arthritis Mother   . Diabetes Mother   . Diabetes Paternal Grandmother   . Colon cancer Neg Hx   . Esophageal cancer Neg Hx   . Stomach cancer Neg Hx   . Rectal cancer Neg Hx     History  Substance Use Topics  . Smoking status: Current Every Day Smoker -- 1.0 packs/day for 30 years    Types: Cigarettes  . Smokeless tobacco: Never Used  . Alcohol Use: Yes     Comment: 1 beer once a year    OB History    Grav Para Term Preterm Abortions TAB SAB Ect Mult Living                  Review  of Systems  All other systems reviewed and are negative.    Allergies  Review of patient's allergies indicates no known allergies.  Home Medications   Current Outpatient Rx  Name  Route  Sig  Dispense  Refill  . ASPIRIN 325 MG PO TABS   Oral   Take 325 mg by mouth daily.           BP 137/65  Pulse 121  Temp 98.7 F (37.1 C) (Oral)  Resp 20  SpO2 95%  LMP 02/01/2012  Physical Exam  Nursing note and vitals reviewed. Constitutional: She is oriented to person, place, and time. She appears well-developed and well-nourished. No distress.  HENT:  Head: Normocephalic and atraumatic.  Eyes:       Normal appearance  Neck: Normal range of motion.  Cardiovascular: Normal rate, regular rhythm and intact distal pulses.   Pulmonary/Chest: Effort normal and breath sounds normal. No respiratory distress.       No pleuritic pain reported.  Mild tenderness of sternum  Abdominal: Soft. Bowel sounds are normal. She exhibits no distension. There is no tenderness. There is no guarding.       Mild epigastric tenderness  Musculoskeletal: Normal range of motion.       Mild, symmetric non-pitting edema bilateral LEs  Neurological: She is alert and oriented to person, place, and time.  Skin: Skin is warm and dry. No rash noted.  Psychiatric: She has a normal mood and affect. Her behavior is normal.    ED Course  Procedures (including critical care time)   Date: 03/04/2012 (20:49)  Rate: 120 Rhythm: sinus tachycardia  QRS Axis: normal  Intervals: normal  ST/T Wave abnormalities: normal  Conduction Disutrbances:none  Narrative Interpretation:   Old EKG Reviewed: none available   Date: 03/04/2012  (2:23 AM   Rate: 81  Rhythm: normal sinus rhythm  QRS Axis: normal  Intervals: normal  ST/T Wave abnormalities: normal  Conduction Disutrbances:none  Narrative Interpretation:   Old EKG Reviewed: no sig changes    Labs Reviewed  CBC WITH DIFFERENTIAL - Abnormal; Notable for the  following:    WBC 20.5 (*)     RBC 5.19 (*)     Hemoglobin 15.7 (*)     HCT 47.0 (*)     Neutrophils Relative 88 (*)     Neutro Abs 18.1 (*)     Lymphocytes Relative 7 (*)     All other components within normal limits  COMPREHENSIVE METABOLIC PANEL - Abnormal; Notable for the following:    Glucose, Bld 160 (*)     Total Bilirubin 0.2 (*)     All other components within normal limits  URINALYSIS, MICROSCOPIC ONLY - Abnormal; Notable for the following:    APPearance CLOUDY (*)     Hgb urine dipstick LARGE (*)     Protein, ur 100 (*)     Bacteria, UA MANY (*)     Squamous Epithelial / LPF MANY (*)     Casts HYALINE CASTS (*)     All other components within normal limits  LIPASE, BLOOD  POCT PREGNANCY, URINE  POCT I-STAT TROPONIN I  POCT I-STAT TROPONIN I  URINE CULTURE   US Abdomen Complete  03/02/2012  *RADIOLOGY REPORT*  Clinical Data:  Epigastric abdominal pain  COMPLETE ABDOMINAL ULTRASOUND  Comparison:  None.  Findings:  Gallbladder:  Echogenic foci within the gallbladder may reflect sludge, nonshadowing stones, or artifact.  No shadowing stones identified.  Negative sonographic Murphy's sign.  No gallbladder wall thickening.  Common bile duct:  Measures 4 mm, within normal limits.  Liver:  Heterogeneous/increased in echogenicity.  This limits the sensitivity of focal liver lesion detection.  There is a geographic hypoechoic area adjacent to the gallbladder fossa, favored to reflect focal fatty sparing.  IVC:  Appears normal.  Pancreas:  Poorly visualized,including portions of the head/uncinate, body and tail.  No focal abnormality visualized where seen.  Spleen:  Measures 10 cm.  No focal abnormality.  Right Kidney:  Measures 11 cm.  No hydronephrosis or focal abnormality.  Left Kidney:  Measures 10.5 cm.  No hydronephrosis or focal abnormality.  Abdominal aorta:  No aneurysm identified.  IMPRESSION: Question gallbladder sludge versus nonshadowing stones.  No sonographic evidence for  cholecystitis.  Heterogeneous/increased liver echogenicity is in keeping with hepatic steatosis.  Focal lesion detection is limited in this setting.   Original Report Authenticated By: Jearld Lesch, M.D.      1. Chest pain       MDM  [phone] F presents w/ intermittent, non-exertional, post-prandial substernal CP w/ associated SOB, nausea, hot flashes and lightheadedness.  [place]orea of gallbladder obtained by GI yesterday and shows possible biliary  sludge.  Pain reproducible w/ palpation of epigastrium and sternum, and exam also sig for trace bilateral LE edema.  EKG non-ischemic and labs unremarkable.  Has RF for ACS but clinical suspicion low.  Pt receiving aspirin as well as GI cocktail and IV protonix/pepcid.  Will check a 3 hour troponin and  reassess shortly.  11:51 PM   Pt reports that pain improved w/ GI cocktail and IV antacids.  I mistakenly did not order a CXR but will order now because HR has been fluctuating between 85 and 95bmp and O2 sat b/t 92 and 95% on room air.  She has had congestion but denies cough/fever.  Dr. Norlene Campbell is otherwise in agreement w/ discharge w/ cardiology f/u.         Otilio Miu, [place] 03/04/12 [phone]

## 2012-03-03 NOTE — ED Notes (Signed)
Per ems: pt c/o epigastric pain radiating to her back x 5 months.  States pain has been intermitetnt.  OBGYN stated she had an "inflamed uterus" and was given abx with some relief.  HR 110-120.

## 2012-03-04 ENCOUNTER — Emergency Department (HOSPITAL_COMMUNITY): Payer: 59

## 2012-03-04 LAB — URINE CULTURE

## 2012-03-04 LAB — POCT I-STAT TROPONIN I

## 2012-03-04 MED ORDER — SUCRALFATE 1 G PO TABS: 1.0000 g | ORAL_TABLET | Freq: Four times a day (QID) | ORAL | Status: DC

## 2012-03-04 MED ORDER — OMEPRAZOLE 20 MG PO CPDR: 20.0000 mg | DELAYED_RELEASE_CAPSULE | Freq: Every day | ORAL | Status: DC

## 2012-03-04 NOTE — ED Provider Notes (Signed)
Medical screening examination/treatment/procedure(s) were performed by non-physician practitioner and as supervising physician I was immediately available for consultation/collaboration.  Olivia Mackie, MD 03/04/12 [phone]

## 2012-03-04 NOTE — ED Notes (Signed)
Assumed care of pt. Denies any pain, discomfort at this time. States she "feels like her normal self." Reports that the GI cocktail alleviated her pain .

## 2012-03-04 NOTE — ED Notes (Signed)
Alert, NAD, calm, interactive, skin W&D, resps e/u, speaking in clear complete sentences. Here for sx of: CP, dizziness fast heart rate. Pain has been both constant and intermittant since June. Reports endoscopy done last week and was "normal". Rates pain a 1/10 at this time. (Denies: nvd, fever, bleeding or other sx). Moved to A5, report given to Dillard Cannon, Charity fundraiser.

## 2012-03-07 ENCOUNTER — Other Ambulatory Visit: Payer: Self-pay

## 2012-03-07 DIAGNOSIS — R109 Unspecified abdominal pain: Secondary | ICD-10-CM

## 2012-03-07 NOTE — Progress Notes (Signed)
You have been scheduled for a HIDA scan at [facility] (1st floor) at 10 am . Please arrive 15 minutes prior to your scheduled appointment on 03/28/12. Make certain not to have anything to eat or drink at least 6 hours prior to your test. Should this appointment date or time not work well for you, please call radiology scheduling at [phone].  _____________________________________________________________________ hepatobiliary (HIDA) scan is an imaging procedure used to diagnose problems in the liver, gallbladder and bile ducts. In the HIDA scan, a radioactive chemical or tracer is injected into a vein in your arm. The tracer is handled by the liver like bile. Bile is a fluid produced and excreted by your liver that helps your digestive system break down fats in the foods you eat. Bile is stored in your gallbladder and the gallbladder releases the bile when you eat a meal. A special nuclear medicine scanner (gamma camera) tracks the flow of the tracer from your liver into your gallbladder and small intestine.  During your HIDA scan  You'll be asked to change into a hospital gown before your HIDA scan begins. Your health care team will position you on a table, usually on your back. The radioactive tracer is then injected into a vein in your arm.The tracer travels through your bloodstream to your liver, where it's taken up by the bile-producing cells. The radioactive tracer travels with the bile from your liver into your gallbladder and through your bile ducts to your small intestine.You may feel some pressure while the radioactive tracer is injected into your vein. As you lie on the table, a special gamma camera is positioned over your abdomen taking pictures of the tracer as it moves through your body. The gamma camera takes pictures continually for about an hour. You'll need to keep still during the HIDA scan. This can become uncomfortable, but you may find that you can lessen the discomfort by taking deep breaths and  thinking about other things. Tell your health care team if you're uncomfortable. The radiologist will watch on a computer the progress of the radioactive tracer through your body. The HIDA scan may be stopped when the radioactive tracer is seen in the gallbladder and enters your small intestine. This typically takes about an hour. In some cases extra imaging will be performed if original images aren't satisfactory, if morphine is given to help visualize the gallbladder or if the medication CCK is given to look at the contraction of the gallbladder. This test typically takes 2 hours to complete. ________________________________________________________________________  Pt aware and instructed

## 2012-03-09 ENCOUNTER — Telehealth: Payer: Self-pay

## 2012-03-09 NOTE — Telephone Encounter (Signed)
Peer to Peer is requested will you call [phone] option 3 case number [phone] she is scheduled for a HIDA scan 03/28/12

## 2012-03-09 NOTE — Telephone Encounter (Signed)
I spoke with [facility] [facility]   She has been authorized to have the HIDA [id number]

## 2012-03-28 ENCOUNTER — Encounter (HOSPITAL_COMMUNITY)
Admission: RE | Admit: 2012-03-28 | Discharge: 2012-03-28 | Disposition: A | Discharge status: Home / Self Care | Payer: 59 | Source: Ambulatory Visit | Attending: Gastroenterology | Admitting: Gastroenterology

## 2012-03-28 DIAGNOSIS — R109 Unspecified abdominal pain: Secondary | ICD-10-CM

## 2012-03-29 ENCOUNTER — Telehealth: Payer: Self-pay | Admitting: Gastroenterology

## 2012-03-29 NOTE — Telephone Encounter (Signed)
See result note.  

## 2012-04-01 ENCOUNTER — Encounter (HOSPITAL_COMMUNITY): Payer: Self-pay

## 2012-04-07 ENCOUNTER — Encounter (HOSPITAL_COMMUNITY)
Admission: RE | Admit: 2012-04-07 | Discharge: 2012-04-07 | Disposition: A | Discharge status: Home / Self Care | Payer: 59 | Source: Ambulatory Visit | Attending: Obstetrics and Gynecology | Admitting: Obstetrics and Gynecology

## 2012-04-07 ENCOUNTER — Encounter (HOSPITAL_COMMUNITY): Payer: Self-pay

## 2012-04-07 HISTORY — DX: Dermatitis, unspecified: L30.9

## 2012-04-07 HISTORY — DX: Shortness of breath: R06.02

## 2012-04-07 HISTORY — DX: Calculus of gallbladder without cholecystitis without obstruction: K80.20

## 2012-04-07 LAB — CBC
MCH: 29.6 pg (ref 26.0–34.0)
Platelets: 287 10*3/uL (ref 150–400)
RBC: 5.03 MIL/uL (ref 3.87–5.11)
WBC: 9.5 10*3/uL (ref 4.0–10.5)

## 2012-04-07 LAB — SURGICAL PCR SCREEN: MRSA, PCR: NEGATIVE

## 2012-04-07 NOTE — Patient Instructions (Signed)
Your procedure is scheduled on:04/14/12  Enter through the Main Entrance at :6am Pick up desk phone and dial [phone] and inform us of your arrival.  Please call [phone] if you have any problems the morning of surgery.  Remember: Do not eat or drink after midnight:WED   Take these meds the morning of surgery with a sip of water:none  DO NOT wear jewelry, eye make-up, lipstick,body lotion, or dark fingernail polish. Do not shave for 48 hours prior to surgery.  If you are to be admitted after surgery, leave suitcase in car until your room has been assigned.

## 2012-04-13 NOTE — H&P (Signed)
46 year old G 0 presents for TAH and BSO. She has been having lower abdominal pain/pressure. Ultrasound in the office revealed 3.7 cm fibroid and bilateral ovarian cysts consistent with endometriomas. The cyst on the left ovary is 6.1 x 4.4 x 3.6 cm   Med Hx Headaches  Surg History  None  Meds  None  NKDA  Social history  Single 1 ppd tobacco ROS is unremarkable except for pain, nausea, pressure  Family history  Positive for arthritis, diabetes and skin cancer  Afebrile VSS General alert and oriented Lung CTAB Car RRR Abdomen is soft and non tender Pelvic is significant for bilateral adnexal tenderness with fullness appreciated on left side  Small pelvis  IMPRESSION: Chronic pelvic pain, probable endometriomas  PLAN: TAH and BSO Risks of surgery reviewed with the patient Consent is signed.

## 2012-04-14 ENCOUNTER — Encounter (HOSPITAL_COMMUNITY): Payer: Self-pay | Admitting: *Deleted

## 2012-04-14 ENCOUNTER — Encounter (HOSPITAL_COMMUNITY): Payer: Self-pay | Admitting: Anesthesiology

## 2012-04-14 ENCOUNTER — Encounter (HOSPITAL_COMMUNITY)
Admission: RE | Disposition: A | Discharge status: Home / Self Care | Payer: Self-pay | Source: Ambulatory Visit | Attending: Obstetrics and Gynecology

## 2012-04-14 ENCOUNTER — Inpatient Hospital Stay (HOSPITAL_COMMUNITY): Payer: 59 | Admitting: Anesthesiology

## 2012-04-14 ENCOUNTER — Inpatient Hospital Stay (HOSPITAL_COMMUNITY)
Admission: RE | Admit: 2012-04-14 | Discharge: 2012-04-16 | DRG: 743 | Disposition: A | Discharge status: Home / Self Care | Payer: 59 | Source: Ambulatory Visit | Attending: Obstetrics and Gynecology | Admitting: Obstetrics and Gynecology

## 2012-04-14 DIAGNOSIS — N949 Unspecified condition associated with female genital organs and menstrual cycle: Principal | ICD-10-CM | POA: Diagnosis present

## 2012-04-14 DIAGNOSIS — N8000 Endometriosis of the uterus, unspecified: Secondary | ICD-10-CM | POA: Diagnosis present

## 2012-04-14 DIAGNOSIS — F17200 Nicotine dependence, unspecified, uncomplicated: Secondary | ICD-10-CM

## 2012-04-14 DIAGNOSIS — E559 Vitamin D deficiency, unspecified: Secondary | ICD-10-CM

## 2012-04-14 DIAGNOSIS — D252 Subserosal leiomyoma of uterus: Secondary | ICD-10-CM | POA: Diagnosis present

## 2012-04-14 DIAGNOSIS — D251 Intramural leiomyoma of uterus: Secondary | ICD-10-CM | POA: Diagnosis present

## 2012-04-14 DIAGNOSIS — F329 Major depressive disorder, single episode, unspecified: Secondary | ICD-10-CM

## 2012-04-14 DIAGNOSIS — L089 Local infection of the skin and subcutaneous tissue, unspecified: Secondary | ICD-10-CM

## 2012-04-14 DIAGNOSIS — D509 Iron deficiency anemia, unspecified: Secondary | ICD-10-CM

## 2012-04-14 DIAGNOSIS — E785 Hyperlipidemia, unspecified: Secondary | ICD-10-CM

## 2012-04-14 DIAGNOSIS — N831 Corpus luteum cyst of ovary, unspecified side: Secondary | ICD-10-CM | POA: Diagnosis present

## 2012-04-14 DIAGNOSIS — N8300 Follicular cyst of ovary, unspecified side: Secondary | ICD-10-CM | POA: Diagnosis present

## 2012-04-14 DIAGNOSIS — L0390 Cellulitis, unspecified: Secondary | ICD-10-CM

## 2012-04-14 DIAGNOSIS — N809 Endometriosis, unspecified: Secondary | ICD-10-CM

## 2012-04-14 DIAGNOSIS — R1110 Vomiting, unspecified: Secondary | ICD-10-CM

## 2012-04-14 DIAGNOSIS — N83209 Unspecified ovarian cyst, unspecified side: Secondary | ICD-10-CM | POA: Diagnosis present

## 2012-04-14 HISTORY — PX: SALPINGOOPHORECTOMY: SHX82

## 2012-04-14 HISTORY — PX: ABDOMINAL HYSTERECTOMY: SHX81

## 2012-04-14 SURGERY — HYSTERECTOMY, ABDOMINAL
Anesthesia: General | Site: Abdomen | Wound class: Clean Contaminated

## 2012-04-14 MED ORDER — TRAMADOL HCL 50 MG PO TABS: 50.0000 mg | ORAL_TABLET | Freq: Four times a day (QID) | ORAL | Status: DC | PRN

## 2012-04-14 MED ORDER — FENTANYL CITRATE 0.05 MG/ML IJ SOLN
INTRAMUSCULAR | Status: DC | PRN
  Administered 2012-04-14 (×3): 50 ug via INTRAVENOUS
  Administered 2012-04-14: 100 ug via INTRAVENOUS

## 2012-04-14 MED ORDER — MENTHOL 3 MG MT LOZG
1.0000 | LOZENGE | OROMUCOSAL | Status: DC | PRN
  Administered 2012-04-15: 3 mg via ORAL
  Filled 2012-04-14: qty 9

## 2012-04-14 MED ORDER — GLYCOPYRROLATE 0.2 MG/ML IJ SOLN
INTRAMUSCULAR | Status: AC
  Filled 2012-04-14: qty 2

## 2012-04-14 MED ORDER — CEFAZOLIN SODIUM-DEXTROSE 2-3 GM-% IV SOLR
2.0000 g | INTRAVENOUS | Status: AC
  Administered 2012-04-14: 2 g via INTRAVENOUS

## 2012-04-14 MED ORDER — ROCURONIUM BROMIDE 50 MG/5ML IV SOLN
INTRAVENOUS | Status: AC
  Filled 2012-04-14: qty 1

## 2012-04-14 MED ORDER — DEXAMETHASONE SODIUM PHOSPHATE 4 MG/ML IJ SOLN
INTRAMUSCULAR | Status: DC | PRN
  Administered 2012-04-14: 10 mg via INTRAVENOUS

## 2012-04-14 MED ORDER — DEXAMETHASONE SODIUM PHOSPHATE 10 MG/ML IJ SOLN
INTRAMUSCULAR | Status: AC
  Filled 2012-04-14: qty 1

## 2012-04-14 MED ORDER — KETOROLAC TROMETHAMINE 30 MG/ML IJ SOLN
INTRAMUSCULAR | Status: AC
  Filled 2012-04-14: qty 1

## 2012-04-14 MED ORDER — FENTANYL CITRATE 0.05 MG/ML IJ SOLN
INTRAMUSCULAR | Status: AC
  Filled 2012-04-14: qty 5

## 2012-04-14 MED ORDER — LIDOCAINE HCL (CARDIAC) 20 MG/ML IV SOLN
INTRAVENOUS | Status: DC | PRN
  Administered 2012-04-14 (×2): 30 mg via INTRAVENOUS

## 2012-04-14 MED ORDER — ONDANSETRON HCL 4 MG/2ML IJ SOLN
INTRAMUSCULAR | Status: DC | PRN
  Administered 2012-04-14: 4 mg via INTRAVENOUS

## 2012-04-14 MED ORDER — HYDROMORPHONE HCL PF 1 MG/ML IJ SOLN
INTRAMUSCULAR | Status: DC | PRN
  Administered 2012-04-14: 1 mg via INTRAVENOUS

## 2012-04-14 MED ORDER — CEFAZOLIN SODIUM-DEXTROSE 2-3 GM-% IV SOLR
INTRAVENOUS | Status: AC
  Filled 2012-04-14: qty 50

## 2012-04-14 MED ORDER — ONDANSETRON HCL 4 MG/2ML IJ SOLN
INTRAMUSCULAR | Status: AC
  Filled 2012-04-14: qty 2

## 2012-04-14 MED ORDER — HYDROMORPHONE 0.3 MG/ML IV SOLN
INTRAVENOUS | Status: DC
  Administered 2012-04-14: 11:00:00 via INTRAVENOUS
  Administered 2012-04-14 (×2): 1.2 mg via INTRAVENOUS
  Administered 2012-04-14: 1.39 mg via INTRAVENOUS
  Administered 2012-04-15: 0.19 mg via INTRAVENOUS
  Administered 2012-04-15: 0.4 mg via INTRAVENOUS
  Filled 2012-04-14: qty 25

## 2012-04-14 MED ORDER — PANTOPRAZOLE SODIUM 40 MG PO TBEC
40.0000 mg | DELAYED_RELEASE_TABLET | Freq: Every day | ORAL | Status: DC
  Administered 2012-04-15 – 2012-04-16 (×2): 40 mg via ORAL
  Filled 2012-04-14 (×3): qty 1

## 2012-04-14 MED ORDER — FAMOTIDINE 20 MG PO TABS
20.0000 mg | ORAL_TABLET | Freq: Every day | ORAL | Status: DC | PRN
  Administered 2012-04-16: 20 mg via ORAL
  Filled 2012-04-14: qty 1

## 2012-04-14 MED ORDER — ONDANSETRON HCL 4 MG/2ML IJ SOLN: 4.0000 mg | Freq: Four times a day (QID) | INTRAMUSCULAR | Status: DC | PRN

## 2012-04-14 MED ORDER — GLYCOPYRROLATE 0.2 MG/ML IJ SOLN
INTRAMUSCULAR | Status: DC | PRN
  Administered 2012-04-14: 0.4 mg via INTRAVENOUS

## 2012-04-14 MED ORDER — PROPOFOL 10 MG/ML IV EMUL
INTRAVENOUS | Status: DC | PRN
  Administered 2012-04-14: 180 mg via INTRAVENOUS

## 2012-04-14 MED ORDER — MIDAZOLAM HCL 2 MG/2ML IJ SOLN
INTRAMUSCULAR | Status: AC
  Filled 2012-04-14: qty 2

## 2012-04-14 MED ORDER — HYDROMORPHONE HCL PF 1 MG/ML IJ SOLN
INTRAMUSCULAR | Status: AC
  Filled 2012-04-14: qty 1

## 2012-04-14 MED ORDER — PROPOFOL 10 MG/ML IV EMUL
INTRAVENOUS | Status: AC
  Filled 2012-04-14: qty 20

## 2012-04-14 MED ORDER — NALOXONE HCL 0.4 MG/ML IJ SOLN: 0.4000 mg | INTRAMUSCULAR | Status: DC | PRN

## 2012-04-14 MED ORDER — BUPIVACAINE HCL (PF) 0.25 % IJ SOLN
INTRAMUSCULAR | Status: DC | PRN
  Administered 2012-04-14: 10 mL

## 2012-04-14 MED ORDER — KETOROLAC TROMETHAMINE 30 MG/ML IJ SOLN
INTRAMUSCULAR | Status: DC | PRN
  Administered 2012-04-14: 30 mg via INTRAVENOUS

## 2012-04-14 MED ORDER — LIDOCAINE HCL (CARDIAC) 20 MG/ML IV SOLN
INTRAVENOUS | Status: AC
  Filled 2012-04-14: qty 5

## 2012-04-14 MED ORDER — BUPIVACAINE HCL (PF) 0.25 % IJ SOLN
INTRAMUSCULAR | Status: AC
  Filled 2012-04-14: qty 30

## 2012-04-14 MED ORDER — KETOROLAC TROMETHAMINE 30 MG/ML IJ SOLN: 30.0000 mg | Freq: Once | INTRAMUSCULAR | Status: DC

## 2012-04-14 MED ORDER — IBUPROFEN 600 MG PO TABS
600.0000 mg | ORAL_TABLET | Freq: Four times a day (QID) | ORAL | Status: DC | PRN
  Administered 2012-04-15 – 2012-04-16 (×3): 600 mg via ORAL
  Filled 2012-04-14 (×4): qty 1

## 2012-04-14 MED ORDER — DEXTROSE IN LACTATED RINGERS 5 % IV SOLN
INTRAVENOUS | Status: DC
  Administered 2012-04-14 – 2012-04-15 (×2): via INTRAVENOUS

## 2012-04-14 MED ORDER — TEMAZEPAM 15 MG PO CAPS: 15.0000 mg | ORAL_CAPSULE | Freq: Every evening | ORAL | Status: DC | PRN

## 2012-04-14 MED ORDER — NEOSTIGMINE METHYLSULFATE 1 MG/ML IJ SOLN
INTRAMUSCULAR | Status: DC | PRN
  Administered 2012-04-14: 3 mg via INTRAVENOUS

## 2012-04-14 MED ORDER — HYDROMORPHONE HCL PF 1 MG/ML IJ SOLN
0.2500 mg | INTRAMUSCULAR | Status: DC | PRN
  Administered 2012-04-14 (×3): 0.5 mg via INTRAVENOUS

## 2012-04-14 MED ORDER — SODIUM CHLORIDE 0.9 % IJ SOLN: 9.0000 mL | INTRAMUSCULAR | Status: DC | PRN

## 2012-04-14 MED ORDER — NEOSTIGMINE METHYLSULFATE 1 MG/ML IJ SOLN
INTRAMUSCULAR | Status: AC
  Filled 2012-04-14: qty 1

## 2012-04-14 MED ORDER — DIPHENHYDRAMINE HCL 50 MG/ML IJ SOLN: 12.5000 mg | Freq: Four times a day (QID) | INTRAMUSCULAR | Status: DC | PRN

## 2012-04-14 MED ORDER — HYDROMORPHONE HCL PF 1 MG/ML IJ SOLN
INTRAMUSCULAR | Status: AC
  Administered 2012-04-14: 0.5 mg via INTRAVENOUS
  Filled 2012-04-14: qty 1

## 2012-04-14 MED ORDER — INFLUENZA VIRUS VACC SPLIT PF IM SUSP
0.5000 mL | INTRAMUSCULAR | Status: AC
  Administered 2012-04-15: 0.5 mL via INTRAMUSCULAR

## 2012-04-14 MED ORDER — MIDAZOLAM HCL 5 MG/5ML IJ SOLN
INTRAMUSCULAR | Status: DC | PRN
  Administered 2012-04-14: 2 mg via INTRAVENOUS

## 2012-04-14 MED ORDER — ROCURONIUM BROMIDE 100 MG/10ML IV SOLN
INTRAVENOUS | Status: DC | PRN
  Administered 2012-04-14: 40 mg via INTRAVENOUS
  Administered 2012-04-14: 10 mg via INTRAVENOUS

## 2012-04-14 MED ORDER — DIPHENHYDRAMINE HCL 12.5 MG/5ML PO ELIX: 12.5000 mg | ORAL_SOLUTION | Freq: Four times a day (QID) | ORAL | Status: DC | PRN

## 2012-04-14 MED ORDER — LACTATED RINGERS IV SOLN
INTRAVENOUS | Status: DC
  Administered 2012-04-14: 07:00:00 via INTRAVENOUS

## 2012-04-14 MED ORDER — LACTATED RINGERS IV SOLN
INTRAVENOUS | Status: DC
  Administered 2012-04-14 (×2): via INTRAVENOUS

## 2012-04-14 MED ORDER — 0.9 % SODIUM CHLORIDE (POUR BTL) OPTIME
TOPICAL | Status: DC | PRN
  Administered 2012-04-14: 1000 mL

## 2012-04-14 SURGICAL SUPPLY — 39 items
ADH SKN CLS APL DERMABOND .7 (GAUZE/BANDAGES/DRESSINGS) ×2
BRR ADH 6X5 SEPRAFILM 1 SHT (MISCELLANEOUS)
CANISTER SUCTION 2500CC (MISCELLANEOUS) ×3 IMPLANT
CHLORAPREP W/TINT 26ML (MISCELLANEOUS) ×3 IMPLANT
CLOTH BEACON ORANGE TIMEOUT ST (SAFETY) ×3 IMPLANT
DECANTER SPIKE VIAL GLASS SM (MISCELLANEOUS) IMPLANT
DERMABOND ADVANCED (GAUZE/BANDAGES/DRESSINGS) ×1
DERMABOND ADVANCED .7 DNX12 (GAUZE/BANDAGES/DRESSINGS) ×2 IMPLANT
DRSG COVADERM 4X10 (GAUZE/BANDAGES/DRESSINGS) ×1 IMPLANT
GAUZE SPONGE 4X4 16PLY XRAY LF (GAUZE/BANDAGES/DRESSINGS) IMPLANT
GLOVE BIO SURGEON STRL SZ 6.5 (GLOVE) ×6 IMPLANT
GOWN PREVENTION PLUS LG XLONG (DISPOSABLE) ×9 IMPLANT
NDL HYPO 25X1 1.5 SAFETY (NEEDLE) IMPLANT
NEEDLE HYPO 22GX1.5 SAFETY (NEEDLE) ×3 IMPLANT
NEEDLE HYPO 25X1 1.5 SAFETY (NEEDLE) ×3 IMPLANT
NS IRRIG 1000ML POUR BTL (IV SOLUTION) ×3 IMPLANT
PACK ABDOMINAL GYN (CUSTOM PROCEDURE TRAY) ×3 IMPLANT
PAD OB MATERNITY 4.3X12.25 (PERSONAL CARE ITEMS) ×3 IMPLANT
PROTECTOR NERVE ULNAR (MISCELLANEOUS) ×3 IMPLANT
SEPRAFILM MEMBRANE 5X6 (MISCELLANEOUS) IMPLANT
SPONGE LAP 18X18 X RAY DECT (DISPOSABLE) ×6 IMPLANT
STAPLER VISISTAT 35W (STAPLE) IMPLANT
SUT PDS AB 0 CT 36 (SUTURE) IMPLANT
SUT PDS AB 0 CTX 60 (SUTURE) IMPLANT
SUT PLAIN 2 0 XLH (SUTURE) IMPLANT
SUT VIC AB 0 CT1 18XCR BRD8 (SUTURE) ×4 IMPLANT
SUT VIC AB 0 CT1 27 (SUTURE) ×12
SUT VIC AB 0 CT1 27XBRD ANBCTR (SUTURE) ×8 IMPLANT
SUT VIC AB 0 CT1 27XCR 8 STRN (SUTURE) IMPLANT
SUT VIC AB 0 CT1 8-18 (SUTURE) ×9
SUT VIC AB 3-0 PS1 18 (SUTURE)
SUT VIC AB 3-0 PS1 18X BRD (SUTURE) IMPLANT
SUT VIC AB 4-0 KS 27 (SUTURE) ×4 IMPLANT
SUT VICRYL 0 TIES 12 18 (SUTURE) ×3 IMPLANT
SYR CONTROL 10ML LL (SYRINGE) IMPLANT
SYRINGE 10CC LL (SYRINGE) ×3 IMPLANT
TOWEL OR 17X24 6PK STRL BLUE (TOWEL DISPOSABLE) ×6 IMPLANT
TRAY FOLEY CATH 14FR (SET/KITS/TRAYS/PACK) ×3 IMPLANT
WATER STERILE IRR 1000ML POUR (IV SOLUTION) ×3 IMPLANT

## 2012-04-14 NOTE — Brief Op Note (Signed)
04/14/2012  8:49 AM  PATIENT:  [name]  46 y.o. female  PRE-OPERATIVE DIAGNOSIS:  fibroid, pelvic pain, possible endometriosis  POST-OPERATIVE DIAGNOSIS:  Stage 4 Endometriosis  PROCEDURE:  Procedure(s) (LRB) with comments: HYSTERECTOMY ABDOMINAL (N/A) SALPINGO OOPHORECTOMY (Bilateral) Extensive LOA  SURGEON:  Surgeon(s) and Role:    * Jeani Hawking, MD - Primary    * Meriel Pica, MD - Assisting  PHYSICIAN ASSISTANT:   ASSISTANTS: none   ANESTHESIA:   general  EBL:  Total I/O In: 700 [I.V.:700] Out: 175 [Urine:25; Blood:150]  BLOOD ADMINISTERED:none  DRAINS: Urinary Catheter (Foley)   LOCAL MEDICATIONS USED:  NONE  SPECIMEN:  Source of Specimen:  uterus, cervix, tubes and ovaries  DISPOSITION OF SPECIMEN:  PATHOLOGY  COUNTS:  YES  TOURNIQUET:  * No tourniquets in log *  DICTATION: .Other Dictation: Dictation Number [id number]  PLAN OF CARE: Admit to inpatient   PATIENT DISPOSITION:  PACU - hemodynamically stable.   Delay start of Pharmacological VTE agent (>24hrs) due to surgical blood loss or risk of bleeding: not applicable

## 2012-04-14 NOTE — Anesthesia Preprocedure Evaluation (Signed)
Anesthesia Evaluation  Patient identified by MRN, date of birth, ID band Patient awake    Reviewed: Allergy & Precautions, H&P , Patient's Chart, lab work & pertinent test results, reviewed documented beta blocker date and time   Airway Mallampati: II TM Distance: >3 FB Neck ROM: full    Dental No notable dental hx.    Pulmonary  breath sounds clear to auscultation  Pulmonary exam normal       Cardiovascular Rhythm:regular Rate:Normal     Neuro/Psych    GI/Hepatic   Endo/Other  Morbid obesity  Renal/GU      Musculoskeletal   Abdominal   Peds  Hematology   Anesthesia Other Findings   Reproductive/Obstetrics                           Anesthesia Physical Anesthesia Plan  ASA: III  Anesthesia Plan: General   Post-op Pain Management:    Induction: Intravenous  Airway Management Planned: Oral ETT  Additional Equipment:   Intra-op Plan:   Post-operative Plan:   Informed Consent: I have reviewed the patients History and Physical, chart, labs and discussed the procedure including the risks, benefits and alternatives for the proposed anesthesia with the patient or authorized representative who has indicated his/her understanding and acceptance.   Dental Advisory Given and Dental advisory given  Plan Discussed with: CRNA and Surgeon  Anesthesia Plan Comments: (  Discussed  general anesthesia, including possible nausea, instrumentation of airway, sore throat,pulmonary aspiration, etc. I asked if the were any outstanding questions, or  concerns before we proceeded. )        Anesthesia Quick Evaluation  

## 2012-04-14 NOTE — Progress Notes (Signed)
History and physical on the chart. No significant changes Will proceed with TAH BSO Consent signed.

## 2012-04-14 NOTE — Transfer of Care (Signed)
Immediate Anesthesia Transfer of Care Note  Patient: [name]  Procedure(s) Performed: Procedure(s) (LRB) with comments: HYSTERECTOMY ABDOMINAL (N/A) SALPINGO OOPHORECTOMY (Bilateral)  Patient Location: PACU  Anesthesia Type:General  Level of Consciousness: awake, sedated and patient cooperative  Airway & Oxygen Therapy: Patient Spontanous Breathing and Patient connected to nasal cannula oxygen  Post-op Assessment: Report given to PACU RN and Post -op Vital signs reviewed and stable  Post vital signs: Reviewed and stable  Complications: No apparent anesthesia complications

## 2012-04-14 NOTE — Anesthesia Postprocedure Evaluation (Signed)
Anesthesia Post Note  Patient: [name]  Procedure(s) Performed: Procedure(s) (LRB): HYSTERECTOMY ABDOMINAL (N/A) SALPINGO OOPHORECTOMY (Bilateral)  Anesthesia type: GA  Patient location: PACU  Post pain: Pain level controlled  Post assessment: Post-op Vital signs reviewed  Last Vitals:  Filed Vitals:   04/14/12 0900  BP: 139/54  Pulse: 105  Temp:   Resp: 18    Post vital signs: Reviewed  Level of consciousness: sedated  Complications: No apparent anesthesia complications

## 2012-04-14 NOTE — Op Note (Signed)
[name], [name]                ACCOUNT NO.:  [id number]  MEDICAL RECORD NO.:  [id number]  LOCATION:  [facility]                          FACILITY:  [facility]  PHYSICIAN:  Elpidio Thielen L. Marieke Lubke, M.D.DATE OF BIRTH:  [birth year]  DATE OF PROCEDURE:  04/14/2012 DATE OF DISCHARGE:                              OPERATIVE REPORT   PREOPERATIVE DIAGNOSES:  Pelvic pain and probable endometrioma.  POSTOPERATIVE DIAGNOSES:  Pelvic pain and probable endometrioma.  PROCEDURE:  TAHBSO and lysis of adhesions.  SURGEON:  Marguerite Jarboe L. Vincente Poli, MD  ASSISTANT:  Duke Salvia. Marcelle Overlie, MD  EBL:  200 mL.  COMPLICATIONS:  None.  ANESTHESIA:  General endotracheal anesthesia.  PATHOLOGY:  Uterus, cervix, tubes, and ovaries.  COMPLICATIONS:  None.  DRAINS:  Foley.  PROCEDURE:  The patient was taken to the operating room.  She was intubated.  She was prepped and draped in the usual sterile fashion.  A low transverse incision was made, carried down to fascia.  Fascia was scored in the midline and extended laterally.  Rectus muscles were separated in midline.  Peritoneum was entered bluntly.  Peritoneal incision was then stretched.  Self-retaining retractor was placed in abdominal cavity.  Large and small bowel placed in the upper abdomen. Exam of the pelvis revealed that she had stage IV endometriosis, significant endometriomas that were adherent to the colon in the cul-de- sac and posterior wall of the uterus.  They were matted down pretty badly.  We were able to free them a little bit and there was copious amount of chocolate brown material consistent with chocolate cyst.  We then proceeded with the hysterectomy in the following fashion.  The uterus was markedly enlarged as well with possible adenomyosis and fibroids.  We identified the round ligament on the left side, suture ligated that with 0 Vicryl suture, created our bladder flap.  We did this on the left side as well.  We decided to go ahead and proceed  with removal of the uterus, cervix, and then to go back and remove the ovaries because of the extensive adhesions.  We felt like we would have better visualization.  Curved Heaney clamps were placed across the triple pedicle on either side.  Each pedicle was clamped, cut, and suture ligated using 0 Vicryl suture.  We elevated the uterus and we were able to create our bladder flap in a standard fashion.  Curved Heaney clamps were placed at the internal os.  Each pedicle was clamped, cut, and suture ligated using 0 Vicryl suture.  We then amputated the fundus of the uterus so that we could better visualize her cervix and we did that.  We then elevated the cervix with a tenaculum and placed a series of straight Heaney clamps being snug beside the cervix incorporating the uterosacral and cardinal ligaments.  Each pedicle was clamped, cut, and suture ligated using 0 Vicryl suture.  We walked our way down the cervix.  Eventually we got to the external os.  Each time we clamped we made sure the bladder was well out of our way and we placed curved Heaney clamps beneath the external os.  The pedicles were suture ligated.  The specimen was  removed and identified as cervix and sent with the uterus.  The remainder of the cuff was closed in a series of figure-of-eight using 0 Vicryl suture.  Hemostasis was excellent.  At this point, we then proceeded with our BSO.  We went to the right side of the pelvis.  We identified the ovary.  It was adherent to the sidewall.  We lifted the ovary with a Babcock clamp.  We were able to use blunt and sharp dissection, elevated the ovary easily and the tube did appear to become swollen and edematous.  We left both the tube and the ovary, placed a curved Heaney clamp just beneath that with careful attention.  Her ureter was far away from our clamps.  The pedicle was clamped, cut, and suture ligated using 0 Vicryl suture and secured using a free tie of 0 Vicryl  suture.  The specimen of the right tube and ovary was sent with the remainder of the pathology.  The left ovary was the most densely adherent.  So we identified the ovary, grasped it with a Babcock clamp, and proceeded with identifying the IP ligament.  We used blunt and sharp dissection by careful attention to avoid any interaction with the ureter.  We were able to peel the ovary and then in likewise fashion place a curved Heaney clamp across the IP ligament and the pedicle was secured using 0 Vicryl suture and suture ligature of 0 Vicryl suture.  Hemostasis was very good and irrigation was performed. Hemostasis was then noted to be again very good.  All sponges and instruments were removed from the abdominal cavity.  The peritoneum was closed using 0 Vicryl.  The rectus muscles were reapproximated using 0 Vicryl.  The fascia was closed using 0 Vicryl starting each corner meeting in the midline.  After irrigation of subcutaneous layer, the skin was closed with staples.  All sponge, lap, and instrument counts were correct x2.  The patient went to recovery room in stable condition.     [name] L. Vincente Poli, M.D.     [name]  D:  04/14/2012  T:  04/14/2012  Job:  [id number]

## 2012-04-15 ENCOUNTER — Encounter (HOSPITAL_COMMUNITY): Payer: Self-pay | Admitting: Obstetrics and Gynecology

## 2012-04-15 LAB — CBC
HCT: 38.1 % (ref 36.0–46.0)
MCHC: 31.5 g/dL (ref 30.0–36.0)
MCV: 91.6 fL (ref 78.0–100.0)
RDW: 14 % (ref 11.5–15.5)

## 2012-04-15 MED ORDER — PNEUMOCOCCAL VAC POLYVALENT 25 MCG/0.5ML IJ INJ
0.5000 mL | INJECTION | INTRAMUSCULAR | Status: AC
  Administered 2012-04-15: 0.5 mL via INTRAMUSCULAR
  Filled 2012-04-15: qty 0.5

## 2012-04-15 MED ORDER — OXYCODONE-ACETAMINOPHEN 5-325 MG PO TABS: 1.0000 | ORAL_TABLET | ORAL | Status: DC | PRN

## 2012-04-15 MED ORDER — PNEUMOCOCCAL VAC POLYVALENT 25 MCG/0.5ML IJ INJ: 0.5000 mL | INJECTION | INTRAMUSCULAR | Status: DC

## 2012-04-15 NOTE — Plan of Care (Signed)
Problem: Phase II Progression Outcomes Goal: Pain controlled Outcome: Completed/Met Date Met:  04/15/12 Good pain control with Motrin at this pointy patient is aware she has something stronger if she needs it Goal: Progress activity as tolerated unless otherwise ordered Outcome: Completed/Met Date Met:  04/15/12 Walks the halls and in room and tolerates this well. Goal: Afebrile, VS remain stable Outcome: Completed/Met Date Met:  04/15/12 VSS at this time Goal: Incision/dressings dry and intact Outcome: Completed/Met Date Met:  04/15/12 Dressing CDI with some old small stains marked.Incision care discussed with patient. Goal: Other Phase II Outcomes/Goals Outcome: Completed/Met Date Met:  04/15/12 Passing gas

## 2012-04-15 NOTE — Addendum Note (Signed)
Addendum  created 04/15/12 0827 by Shanon Payor, CRNA   Modules edited:Notes Section

## 2012-04-15 NOTE — Progress Notes (Signed)
1 Day Post-Op Procedure(s) (LRB): HYSTERECTOMY ABDOMINAL (N/A) SALPINGO OOPHORECTOMY (Bilateral)  Subjective: Patient reports incisional pain, tolerating PO and no problems voiding.    Objective: I have reviewed patient's vital signs, intake and output and medications.  General: alert, cooperative and appears stated age Vaginal Bleeding: none Abdomen is soft and nontender and bandage clean and dry  Assessment: s/p Procedure(s) (LRB) with comments: HYSTERECTOMY ABDOMINAL (N/A) SALPINGO OOPHORECTOMY (Bilateral): stable and progressing well  Plan: Advance diet Encourage ambulation Advance to PO medication Discontinue IV fluids  LOS: 1 day    Damonie Furney L 04/15/2012, 7:23 AM

## 2012-04-15 NOTE — Anesthesia Postprocedure Evaluation (Signed)
  Anesthesia Post-op Note  Patient: [name]  Procedure(s) Performed: Procedure(s) (LRB) with comments: HYSTERECTOMY ABDOMINAL (N/A) SALPINGO OOPHORECTOMY (Bilateral)  Patient Location: [facility] unit  Anesthesia Type:General  Level of Consciousness: awake, alert  and oriented  Airway and Oxygen Therapy: Patient Spontanous Breathing and Patient connected to nasal cannula oxygen  Post-op Pain: mild  Post-op Assessment: Post-op Vital signs reviewed and Patient's Cardiovascular Status Stable  Post-op Vital Signs: Reviewed and stable  Complications: No apparent anesthesia complications

## 2012-04-16 MED ORDER — IBUPROFEN 600 MG PO TABS: 600.0000 mg | ORAL_TABLET | Freq: Four times a day (QID) | ORAL | Status: AC | PRN

## 2012-04-16 NOTE — Discharge Summary (Signed)
Admission Diagnosis: Pelvic Pain  Discharge Diagnosis: Endometriosis  Hospital Course: 46 year old female underwent TAH and BSO and LOA on day of admission. By POD #2 she was ambulating, voiding and had excellent pain control on Ibuprofen. She was discharged in good condition. She will follow up with me next Thursday for staple removal.  She was advised no driving x 1 week. No intercourse x 6 weeks. Call for nausea, abdominal pain, redness or drainage from incision site.

## 2012-04-16 NOTE — Progress Notes (Signed)
2 Days Post-Op Procedure(s) (LRB): HYSTERECTOMY ABDOMINAL (N/A) SALPINGO OOPHORECTOMY (Bilateral)  Subjective: Patient reports tolerating PO, + flatus, + BM and no problems voiding.    Objective: I have reviewed patient's vital signs, intake and output and medications.  General: alert, cooperative and appears stated age Vaginal Bleeding: none Abdomen is soft and non tender  Incision is clean and dry and intact  Assessment: s/p Procedure(s) (LRB) with comments: HYSTERECTOMY ABDOMINAL (N/A) SALPINGO OOPHORECTOMY (Bilateral): stable, progressing well and tolerating diet  Plan: Discharge home  LOS: 2 days    Guilherme Schwenke L 04/16/2012, 9:58 AM

## 2012-04-16 NOTE — Progress Notes (Signed)
Discharge instructions given to patient at bedside.  Medications, activity instructions, follow up appointments and community resources discussed.  No questions at this time.  Patient left unit with personal belongings in stable condition, accompanied by staff member.  Osvaldo Angst, RN-----------------

## 2013-03-02 ENCOUNTER — Other Ambulatory Visit: Payer: Self-pay

## 2014-02-09 ENCOUNTER — Other Ambulatory Visit: Payer: Self-pay

## 2014-04-05 ENCOUNTER — Ambulatory Visit (INDEPENDENT_AMBULATORY_CARE_PROVIDER_SITE_OTHER): Payer: 59 | Admitting: Family Medicine

## 2014-04-05 ENCOUNTER — Encounter: Payer: Self-pay | Admitting: Family Medicine

## 2014-04-05 VITALS — BP 144/82 | HR 102 | Temp 98.2°F | Ht 62.5 in | Wt 237.0 lb

## 2014-04-05 DIAGNOSIS — L301 Dyshidrosis [pompholyx]: Secondary | ICD-10-CM | POA: Insufficient documentation

## 2014-04-05 DIAGNOSIS — Z23 Encounter for immunization: Secondary | ICD-10-CM

## 2014-04-05 DIAGNOSIS — F32A Depression, unspecified: Secondary | ICD-10-CM

## 2014-04-05 DIAGNOSIS — L989 Disorder of the skin and subcutaneous tissue, unspecified: Secondary | ICD-10-CM | POA: Insufficient documentation

## 2014-04-05 DIAGNOSIS — Z720 Tobacco use: Secondary | ICD-10-CM

## 2014-04-05 DIAGNOSIS — E559 Vitamin D deficiency, unspecified: Secondary | ICD-10-CM

## 2014-04-05 DIAGNOSIS — F329 Major depressive disorder, single episode, unspecified: Secondary | ICD-10-CM

## 2014-04-05 DIAGNOSIS — E785 Hyperlipidemia, unspecified: Secondary | ICD-10-CM

## 2014-04-05 DIAGNOSIS — Z0000 Encounter for general adult medical examination without abnormal findings: Secondary | ICD-10-CM | POA: Insufficient documentation

## 2014-04-05 DIAGNOSIS — F17200 Nicotine dependence, unspecified, uncomplicated: Secondary | ICD-10-CM

## 2014-04-05 LAB — CBC WITH DIFFERENTIAL/PLATELET
BASOS PCT: 0.3 % (ref 0.0–3.0)
Basophils Absolute: 0 10*3/uL (ref 0.0–0.1)
EOS PCT: 1.2 % (ref 0.0–5.0)
Eosinophils Absolute: 0.1 10*3/uL (ref 0.0–0.7)
HEMATOCRIT: 47.6 % — AB (ref 36.0–46.0)
HEMOGLOBIN: 15.8 g/dL — AB (ref 12.0–15.0)
LYMPHS ABS: 2.5 10*3/uL (ref 0.7–4.0)
Lymphocytes Relative: 32.8 % (ref 12.0–46.0)
MCHC: 33.1 g/dL (ref 30.0–36.0)
MCV: 91.3 fl (ref 78.0–100.0)
MONOS PCT: 6 % (ref 3.0–12.0)
Monocytes Absolute: 0.5 10*3/uL (ref 0.1–1.0)
NEUTROS ABS: 4.6 10*3/uL (ref 1.4–7.7)
Neutrophils Relative %: 59.7 % (ref 43.0–77.0)
Platelets: 276 10*3/uL (ref 150.0–400.0)
RBC: 5.22 Mil/uL — AB (ref 3.87–5.11)
RDW: 14 % (ref 11.5–15.5)
WBC: 7.8 10*3/uL (ref 4.0–10.5)

## 2014-04-05 LAB — COMPREHENSIVE METABOLIC PANEL
ALK PHOS: 73 U/L (ref 39–117)
ALT: 26 U/L (ref 0–35)
AST: 20 U/L (ref 0–37)
Albumin: 4.2 g/dL (ref 3.5–5.2)
BILIRUBIN TOTAL: 0.5 mg/dL (ref 0.2–1.2)
BUN: 13 mg/dL (ref 6–23)
CO2: 24 meq/L (ref 19–32)
CREATININE: 0.6 mg/dL (ref 0.4–1.2)
Calcium: 9 mg/dL (ref 8.4–10.5)
Chloride: 102 mEq/L (ref 96–112)
GFR: 106.41 mL/min (ref >60.00)
Glucose, Bld: 106 mg/dL — ABNORMAL HIGH (ref 70–99)
Potassium: 4 mEq/L (ref 3.5–5.1)
Sodium: 136 mEq/L (ref 135–145)
Total Protein: 7.4 g/dL (ref 6.0–8.3)

## 2014-04-05 LAB — LIPID PANEL
Cholesterol: 236 mg/dL — ABNORMAL HIGH (ref 0–200)
HDL: 34.6 mg/dL — AB (ref >39.00)
NonHDL: 201.4
TRIGLYCERIDES: 227 mg/dL — AB (ref 0.0–149.0)
Total CHOL/HDL Ratio: 7
VLDL: 45.4 mg/dL — AB (ref 0.0–40.0)

## 2014-04-05 LAB — TSH: TSH: 1.37 u[IU]/mL (ref 0.35–4.50)

## 2014-04-05 LAB — LDL CHOLESTEROL, DIRECT: Direct LDL: 170.1 mg/dL

## 2014-04-05 MED ORDER — HALCINONIDE 0.1 % EX CREA: TOPICAL_CREAM | CUTANEOUS | Status: AC

## 2014-04-05 NOTE — Assessment & Plan Note (Signed)
Due for labs today. Orders entered.

## 2014-04-05 NOTE — Assessment & Plan Note (Signed)
Reviewed preventive care protocols, scheduled due services, and updated immunizations Discussed nutrition, exercise, diet, and healthy lifestyle.  Advised to order mammogram.  Influenza and Tdap vaccines given today.

## 2014-04-05 NOTE — Assessment & Plan Note (Signed)
Discussed weight loss options- she lost a significant amount of weight over a year ago and thinks she can do it again. Encouraged her to get active and cut portions. The patient indicates understanding of these issues and agrees with the plan.

## 2014-04-05 NOTE — Assessment & Plan Note (Signed)
Smoking cessation instruction/counseling given:  counseled patient on the dangers of tobacco use, advised patient to stop smoking, and reviewed strategies to maximize success   She is not ready to quit.

## 2014-04-05 NOTE — Progress Notes (Signed)
Subjective:   Patient ID: [name], female    DOB: [birth year], 48 y.o.   MRN: [id number]  [name] is a pleasant 48 y.o. year old female who presents to clinic today with Annual Exam  on 04/05/2014  HPI: I have not seen her for routine care in over 3 years.  S/p complete hysterectomy and oophorectomy a little over a year ago for endometriosis- she feels great!  Not on HRT- mood swings resolved, abdominal pain resolved.  Lost 65 pounds after the surgery but has gained some back.  Was 280 prior to surgery.  Wt Readings from Last 3 Encounters:  04/05/14 237 lb (107.502 kg)  04/15/12 235 lb (106.595 kg)  04/07/12 235 lb (106.595 kg)    Last mammogram 12/17/11. No family h/o breast CA but mother has had numerous breast biopsies.  Overdue for blood work and mammogram as well.  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  Has had this diagnosis for years.Currently only using topical lotions/creams but not effective.  Tobacco abuse- over 20 pack year history.  Not ready to quit. Current Outpatient Prescriptions on File Prior to Visit  Medication Sig Dispense Refill  . ibuprofen (ADVIL,MOTRIN) 600 MG tablet Take 1 tablet (600 mg total) by mouth every 6 (six) hours as needed (mild pain). 60 tablet 0   No current facility-administered medications on file prior to visit.    No Known Allergies  Past Medical History  Diagnosis Date  . Hyperlipidemia   . Tobacco abuse   . Generalized headaches   . Obesity   . Uterine fibroid   . Ovarian cyst   . Hypercholesteremia   . Gallstones     recent dx after chest pain  . Shortness of breath     heavy exertion  . Eczema     Past Surgical History  Procedure Laterality Date  . Endometrial surgery    . Cervical cone biopsy    . Abdominal hysterectomy  04/14/2012    Procedure: HYSTERECTOMY ABDOMINAL;  Surgeon: Cyril Mourning, MD;  Location: [facility];  Service: Gynecology;  Laterality: N/A;  . Salpingoophorectomy  04/14/2012   Procedure: SALPINGO OOPHORECTOMY;  Surgeon: Cyril Mourning, MD;  Location: [facility];  Service: Gynecology;  Laterality: Bilateral;    Family History  Problem Relation Age of Onset  . Heart disease Father   . Diabetes Father   . Skin cancer Father   . Arthritis Mother   . Diabetes Mother   . Diabetes Paternal Grandmother   . Colon cancer Neg Hx   . Esophageal cancer Neg Hx   . Stomach cancer Neg Hx   . Rectal cancer Neg Hx     History   Social History  . Marital Status: Single    Spouse Name: N/A    Number of Children: 0  . Years of Education: N/A   Occupational History  . 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 Smoking status: Current Every Day Smoker -- 1.00 packs/day for 30 years    Types: Cigarettes  . Smokeless tobacco: Never Used  . Alcohol Use: Yes     Comment: 1 beer once a year  . Drug Use: No  . Sexual Activity: Not on file   Other Topics Concern  . Not on file   Social History Narrative   The PMH, PSH, Social History, Family History, Medications, and allergies have been reviewed in [facility], and have been updated if relevant.   Review of Systems  Constitutional: Negative.   HENT: Negative.   Eyes: Negative.   Respiratory: Negative.   Cardiovascular: Negative.   Gastrointestinal: Negative.   Genitourinary: Negative.   Musculoskeletal: Negative.   Skin: Positive for rash.  Neurological: Negative.   Psychiatric/Behavioral: Negative.   All other systems reviewed and are negative.      Objective:    BP 144/82 mmHg  Pulse 102  Temp(Src) 98.2 F (36.8 C) (Oral)  Ht 5' 2.5" (1.588 m)  Wt 237 lb (107.502 kg)  BMI 42.63 kg/m2  SpO2 96%  LMP 04/05/2012   Physical Exam   General:  Well-developed,well-nourished,in no acute distress; alert,appropriate and cooperative throughout examination Head:  normocephalic and atraumatic.   Eyes:  vision grossly intact, pupils equal, pupils round, and pupils reactive to light.   Ears:  R ear normal  and L ear normal.   Nose:  no external deformity.   Mouth:  good dentition.   Neck:  No deformities, masses, or tenderness noted. Lungs:  Normal respiratory effort, chest expands symmetrically. Lungs are clear to auscultation, no crackles or wheezes. Heart:  Normal rate and regular rhythm. S1 and S2 normal without gallop, murmur, click, rub or other extra sounds. Abdomen:  Bowel sounds positive,abdomen soft and non-tender without masses, organomegaly or hernias noted. Msk:  No deformity or scoliosis noted of thoracic or lumbar spine.   Extremities:  No clubbing, cyanosis, edema, or deformity noted with normal full range of motion of all joints.   Neurologic:  alert & oriented X3 and gait normal.   Skin:  Dry peeling skin at base of hands and feet bilaterally Cervical Nodes:  No lymphadenopathy noted Axillary Nodes:  No palpable lymphadenopathy Psych:  Cognition and judgment appear intact. Alert and cooperative with normal attention span and concentration. No apparent delusions, illusions, hallucinations       Assessment & Plan:   Need for influenza vaccination  TOBACCO ABUSE  HLD (hyperlipidemia)  Depression  Vitamin D deficiency  Eczema, dyshidrotic  Facial lesion  Well woman exam (no gynecological exam) No Follow-up on file.

## 2014-04-05 NOTE — Assessment & Plan Note (Signed)
Deteriorated. eRx sent for Halog. Advised Eucerin to area as well.

## 2014-04-05 NOTE — Progress Notes (Signed)
Pre visit review using our clinic review tool, if applicable. No additional management support is needed unless otherwise documented below in the visit note. 

## 2014-04-05 NOTE — Patient Instructions (Addendum)
Great to see you. Happy holidays. Please call Dr. Helane Rima to set up your mammogram.  Use halog cream topically as we discussed.  We will call you with your lab results and you can view them online.

## 2014-04-06 ENCOUNTER — Telehealth: Payer: Self-pay | Admitting: Family Medicine

## 2014-04-06 NOTE — Telephone Encounter (Signed)
emmi emailed °

## 2014-04-19 ENCOUNTER — Telehealth: Payer: Self-pay | Admitting: *Deleted

## 2014-04-19 NOTE — Telephone Encounter (Signed)
Received faxed request asking for prior auth for HALOG 0.1% cream apply topically twice daily, ok to proceed with prior auth?

## 2014-04-19 NOTE — Telephone Encounter (Signed)
Helyn App with [place] request status of Halog cream PA; advised request was received and sent to PCP.

## 2014-04-25 NOTE — Telephone Encounter (Signed)
Gave prior auth request to Dr.Aron's CMA

## 2014-05-21 ENCOUNTER — Other Ambulatory Visit: Payer: Self-pay | Admitting: Family Medicine

## 2014-05-21 MED ORDER — SIMVASTATIN 20 MG PO TABS: 20.0000 mg | ORAL_TABLET | Freq: Every day | ORAL | Status: DC

## 2014-05-21 MED ORDER — TRIAMCINOLONE ACETONIDE 0.1 % EX CREA: 1.0000 "application " | TOPICAL_CREAM | Freq: Two times a day (BID) | CUTANEOUS | Status: AC

## 2014-05-21 NOTE — Addendum Note (Signed)
Addended by: Lucille Passy on: 05/21/2014 06:55 PM   Modules accepted: Miquel Dunn

## 2014-07-13 ENCOUNTER — Other Ambulatory Visit: Payer: Self-pay | Admitting: Family Medicine

## 2014-07-13 DIAGNOSIS — E785 Hyperlipidemia, unspecified: Secondary | ICD-10-CM

## 2014-07-19 ENCOUNTER — Other Ambulatory Visit (INDEPENDENT_AMBULATORY_CARE_PROVIDER_SITE_OTHER): Payer: 59

## 2014-07-19 ENCOUNTER — Encounter: Payer: Self-pay | Admitting: Family Medicine

## 2014-07-19 DIAGNOSIS — E785 Hyperlipidemia, unspecified: Secondary | ICD-10-CM

## 2014-07-19 LAB — COMPREHENSIVE METABOLIC PANEL
ALK PHOS: 79 U/L (ref 39–117)
ALT: 33 U/L (ref 0–35)
AST: 25 U/L (ref 0–37)
Albumin: 4.2 g/dL (ref 3.5–5.2)
BUN: 12 mg/dL (ref 6–23)
CALCIUM: 9.2 mg/dL (ref 8.4–10.5)
CHLORIDE: 105 meq/L (ref 96–112)
CO2: 28 mEq/L (ref 19–32)
CREATININE: 0.73 mg/dL (ref 0.40–1.20)
GFR: 89.66 mL/min (ref >60.00)
Glucose, Bld: 107 mg/dL — ABNORMAL HIGH (ref 70–99)
Potassium: 4 mEq/L (ref 3.5–5.1)
Sodium: 139 mEq/L (ref 135–145)
Total Bilirubin: 0.3 mg/dL (ref 0.2–1.2)
Total Protein: 7.4 g/dL (ref 6.0–8.3)

## 2014-07-19 LAB — LIPID PANEL
CHOLESTEROL: 212 mg/dL — AB (ref 0–200)
HDL: 31.7 mg/dL — ABNORMAL LOW (ref >39.00)
LDL CALC: 141 mg/dL — AB (ref 0–99)
NonHDL: 180.3
Total CHOL/HDL Ratio: 7
Triglycerides: 197 mg/dL — ABNORMAL HIGH (ref 0.0–149.0)
VLDL: 39.4 mg/dL (ref 0.0–40.0)

## 2014-07-20 IMAGING — US US ABDOMEN COMPLETE
1 series · 13 of 25 positions shown · non-contrast
Comparison: None.

CLINICAL DATA: Epigastric abdominal pain

COMPLETE ABDOMINAL ULTRASOUND

[Series 1: us abdomen complete · 0.32mm/px · 13 of 111 slices shown]
[im 1/111]
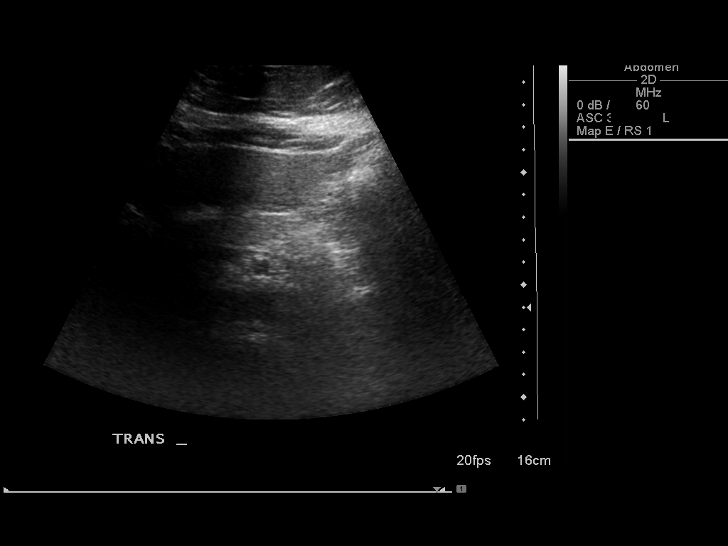
[im 10/111]
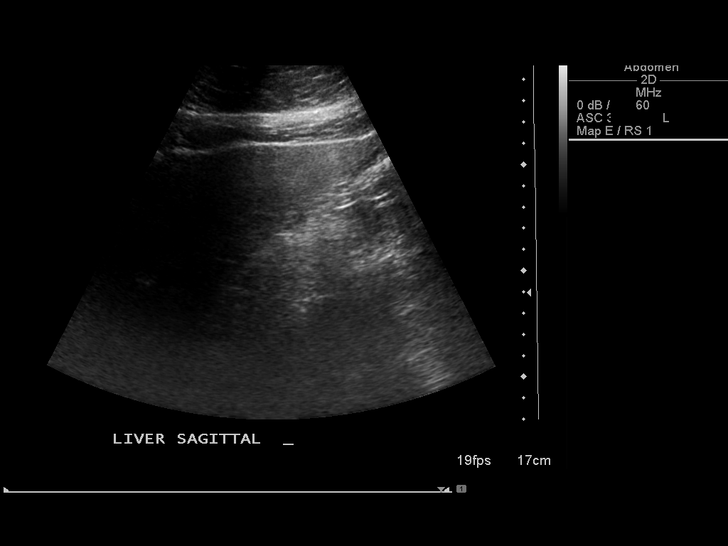
[im 19/111]
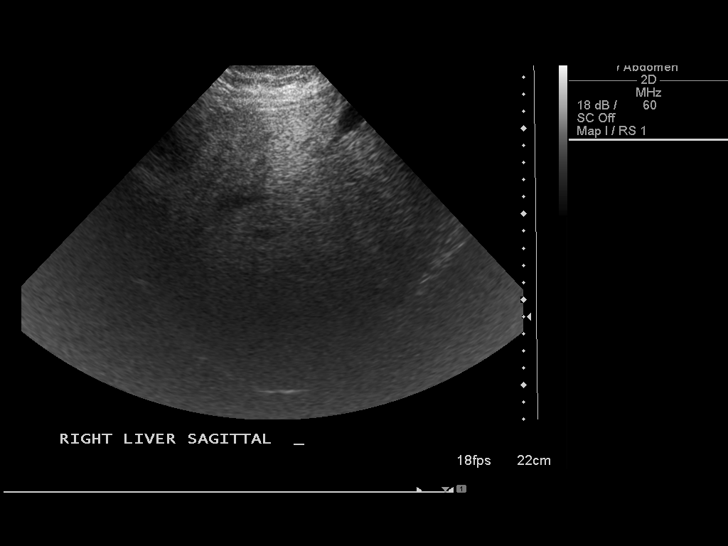
[im 28/111]
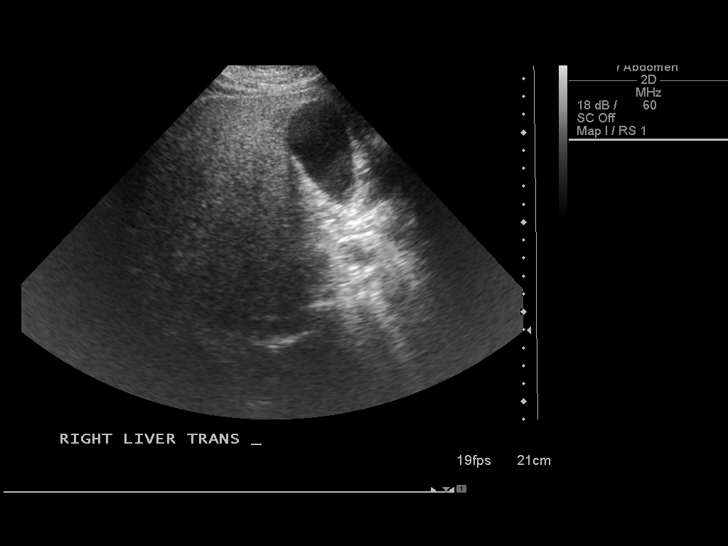
[im 37/111]
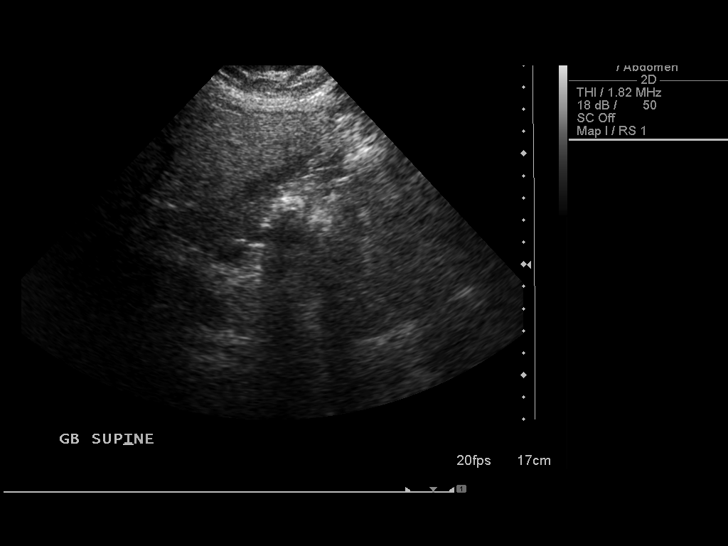
[im 46/111]
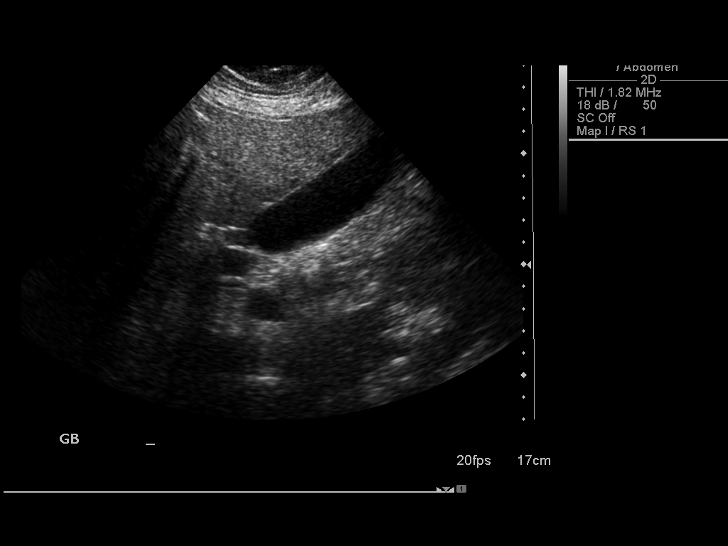
[im 56/111]
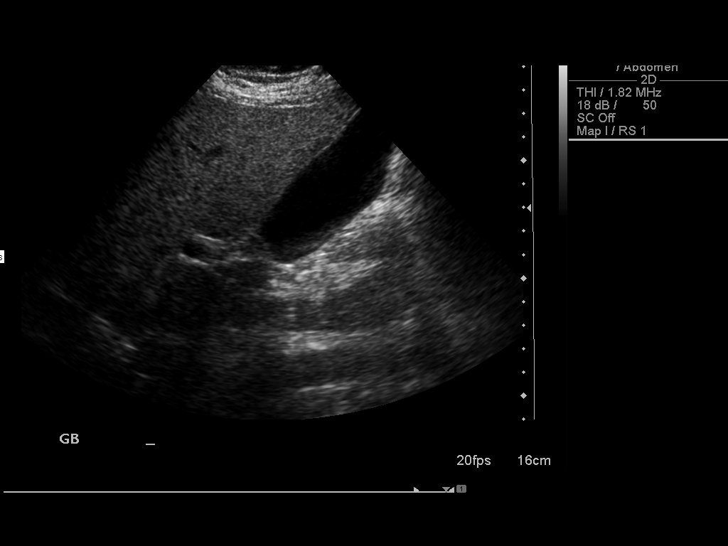
[im 65/111]
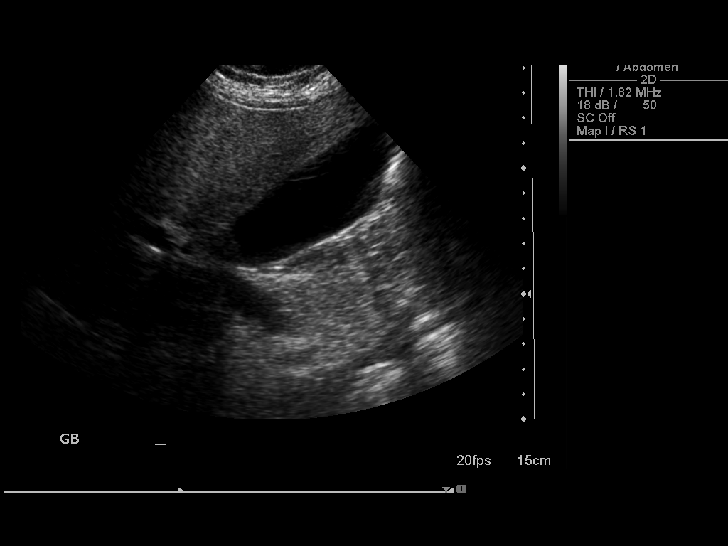
[im 74/111]
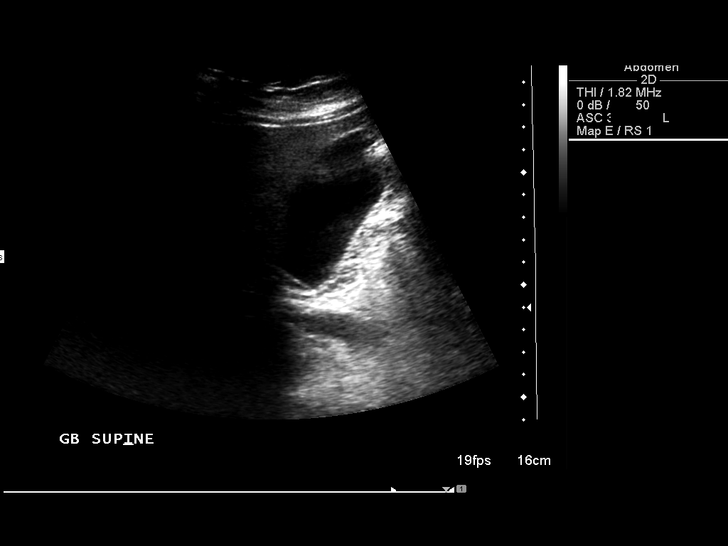
[im 83/111]
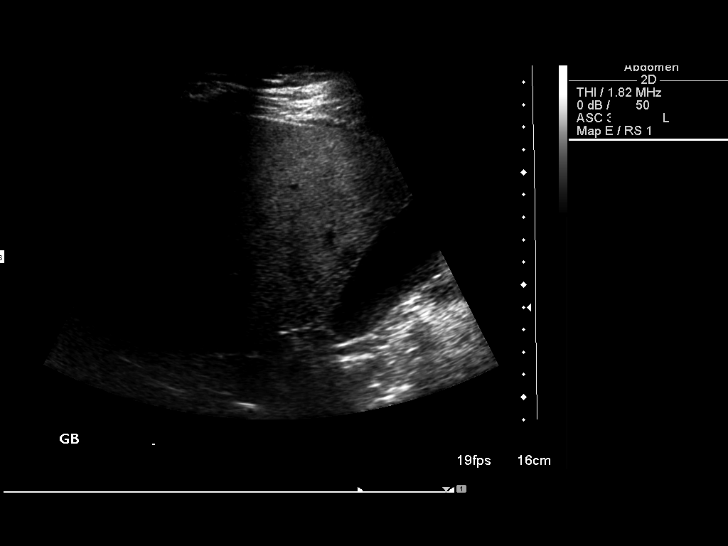
[im 92/111]
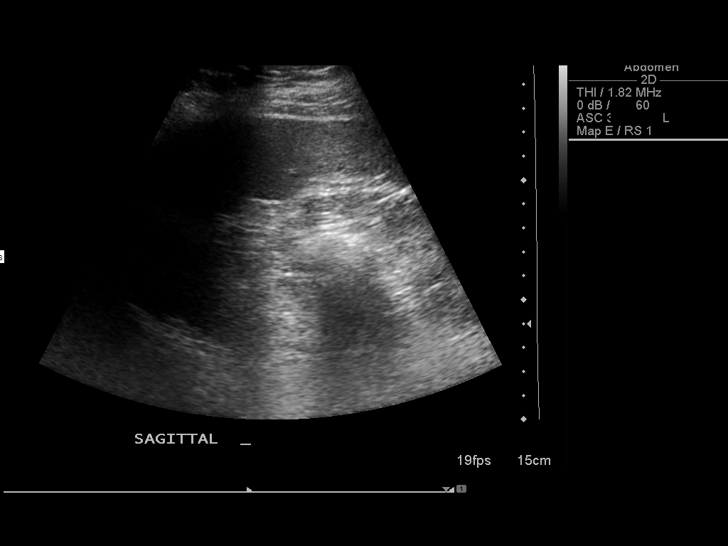
[im 101/111]
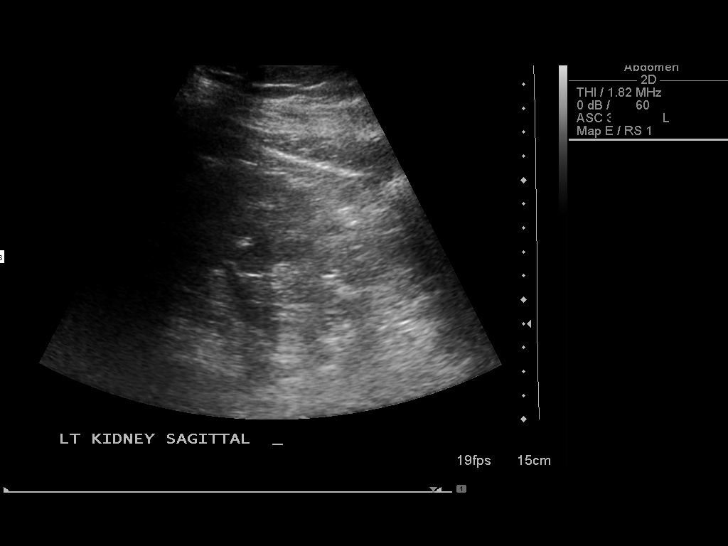
[im 111/111]
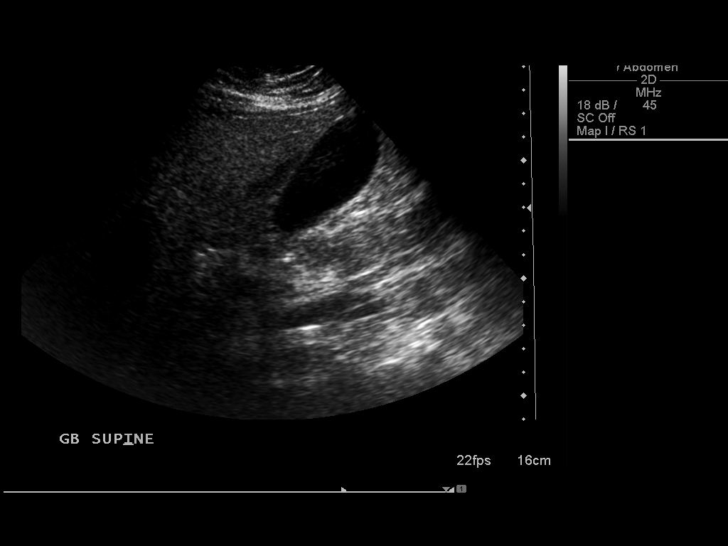

[13 of 25 positions shown; findings below may reference images not displayed]

FINDINGS: Gallbladder:  Echogenic foci within the gallbladder may reflect
sludge, nonshadowing stones, or artifact.  No shadowing stones
identified.  Negative sonographic Murphy's sign.  No gallbladder
wall thickening.

Common bile duct:  Measures 4 mm, within normal limits.

Liver:  Heterogeneous/increased in echogenicity.  This limits the
sensitivity of focal liver lesion detection.  There is a geographic
hypoechoic area adjacent to the gallbladder fossa, favored to
reflect focal fatty sparing.

IVC:  Appears normal.

Pancreas:  Poorly visualized,including portions of the
head/uncinate, body and tail.  No focal abnormality visualized
where seen.

Spleen:  Measures 10 cm.  No focal abnormality.

Right Kidney:  Measures 11 cm.  No hydronephrosis or focal
abnormality.

Left Kidney:  Measures 10.5 cm.  No hydronephrosis or focal
abnormality.

Abdominal aorta:  No aneurysm identified.
IMPRESSION: Question gallbladder sludge versus nonshadowing stones.  No
sonographic evidence for cholecystitis.

Heterogeneous/increased liver echogenicity is in keeping with
hepatic steatosis.  Focal lesion detection is limited in this
setting.

## 2014-07-22 IMAGING — CR DG CHEST 2V
2 series · 2 of 2 positions shown · non-contrast
Comparison: None.

CLINICAL DATA: Cough, chest pain and shortness of breath.

CHEST - 2 VIEW

[w chest pa]
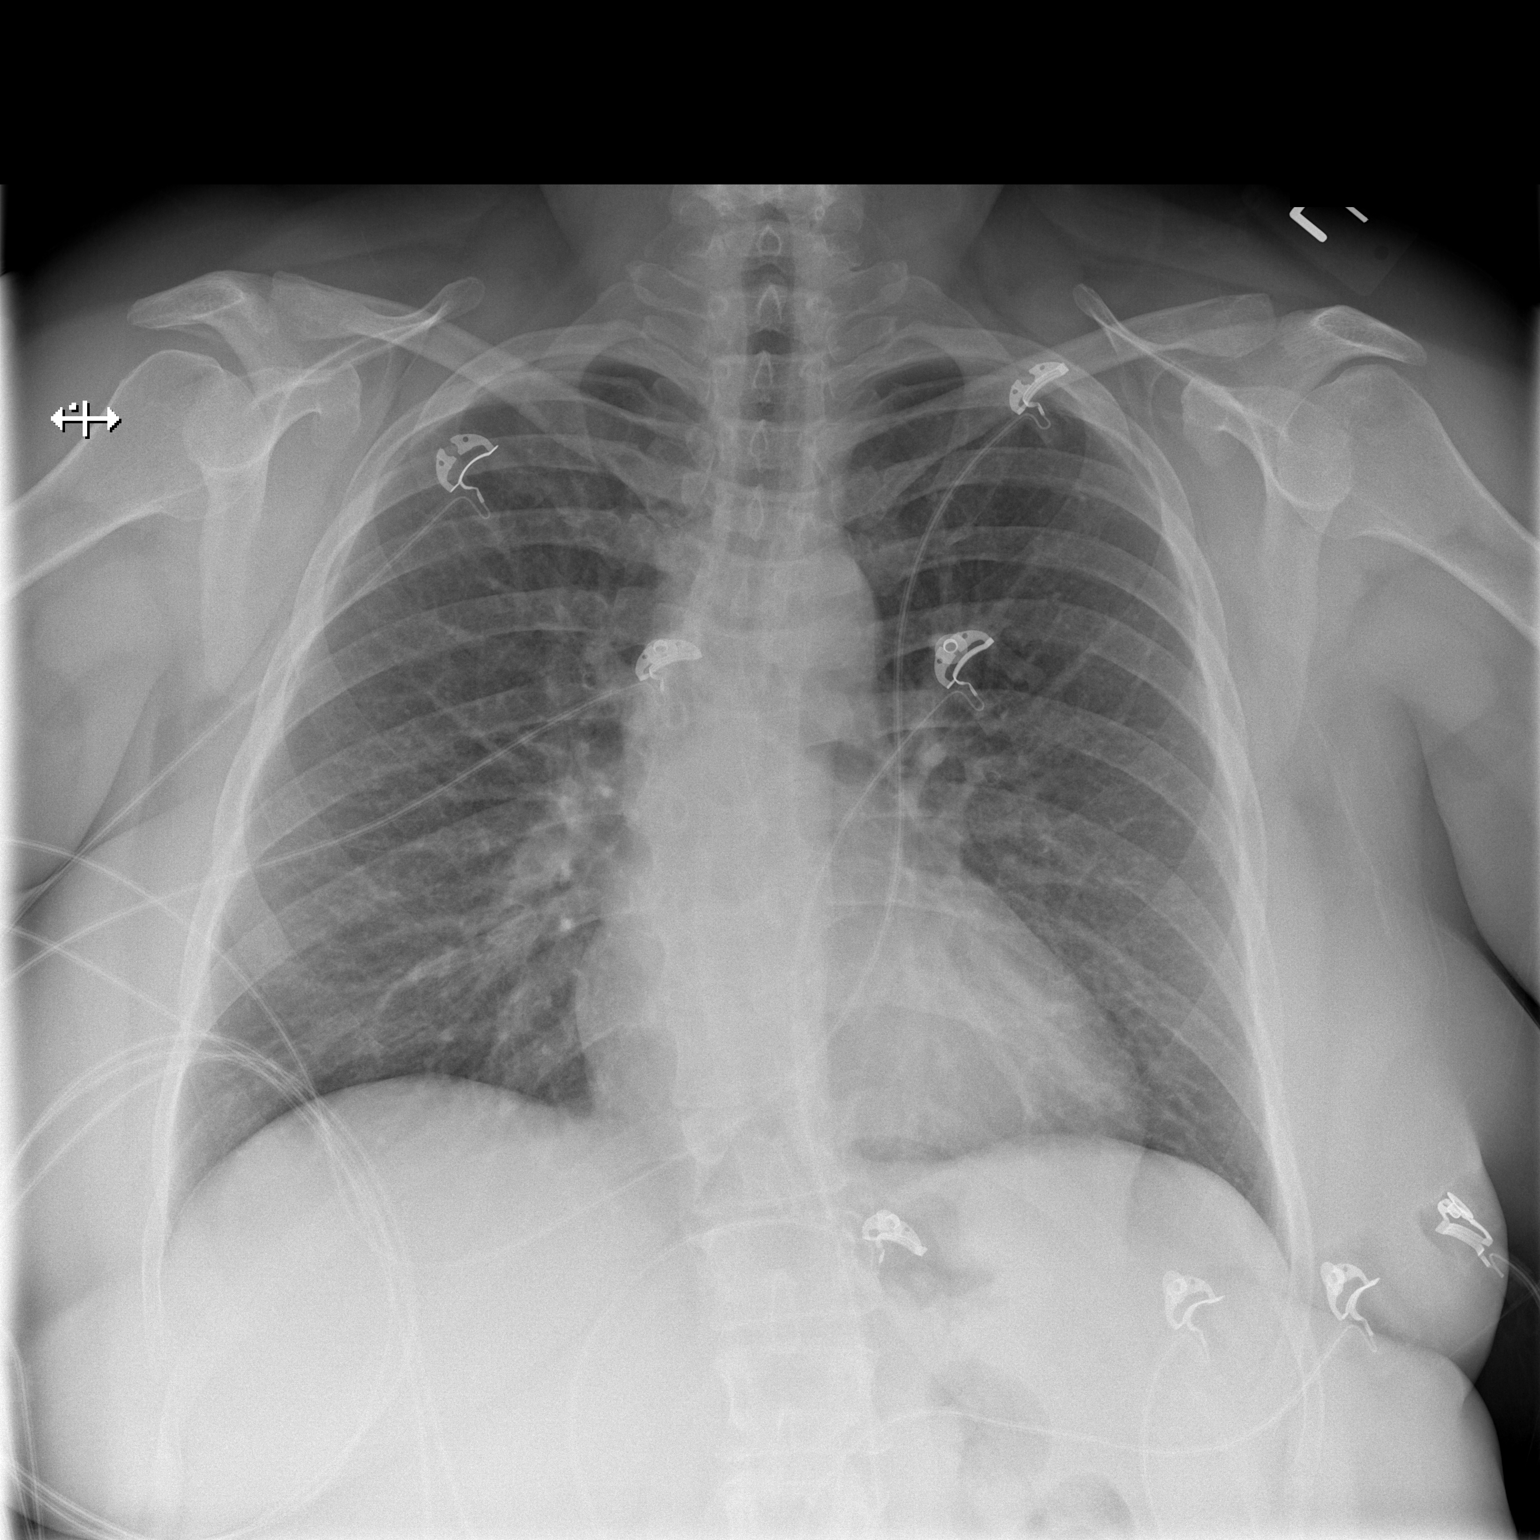

[w chest lat]
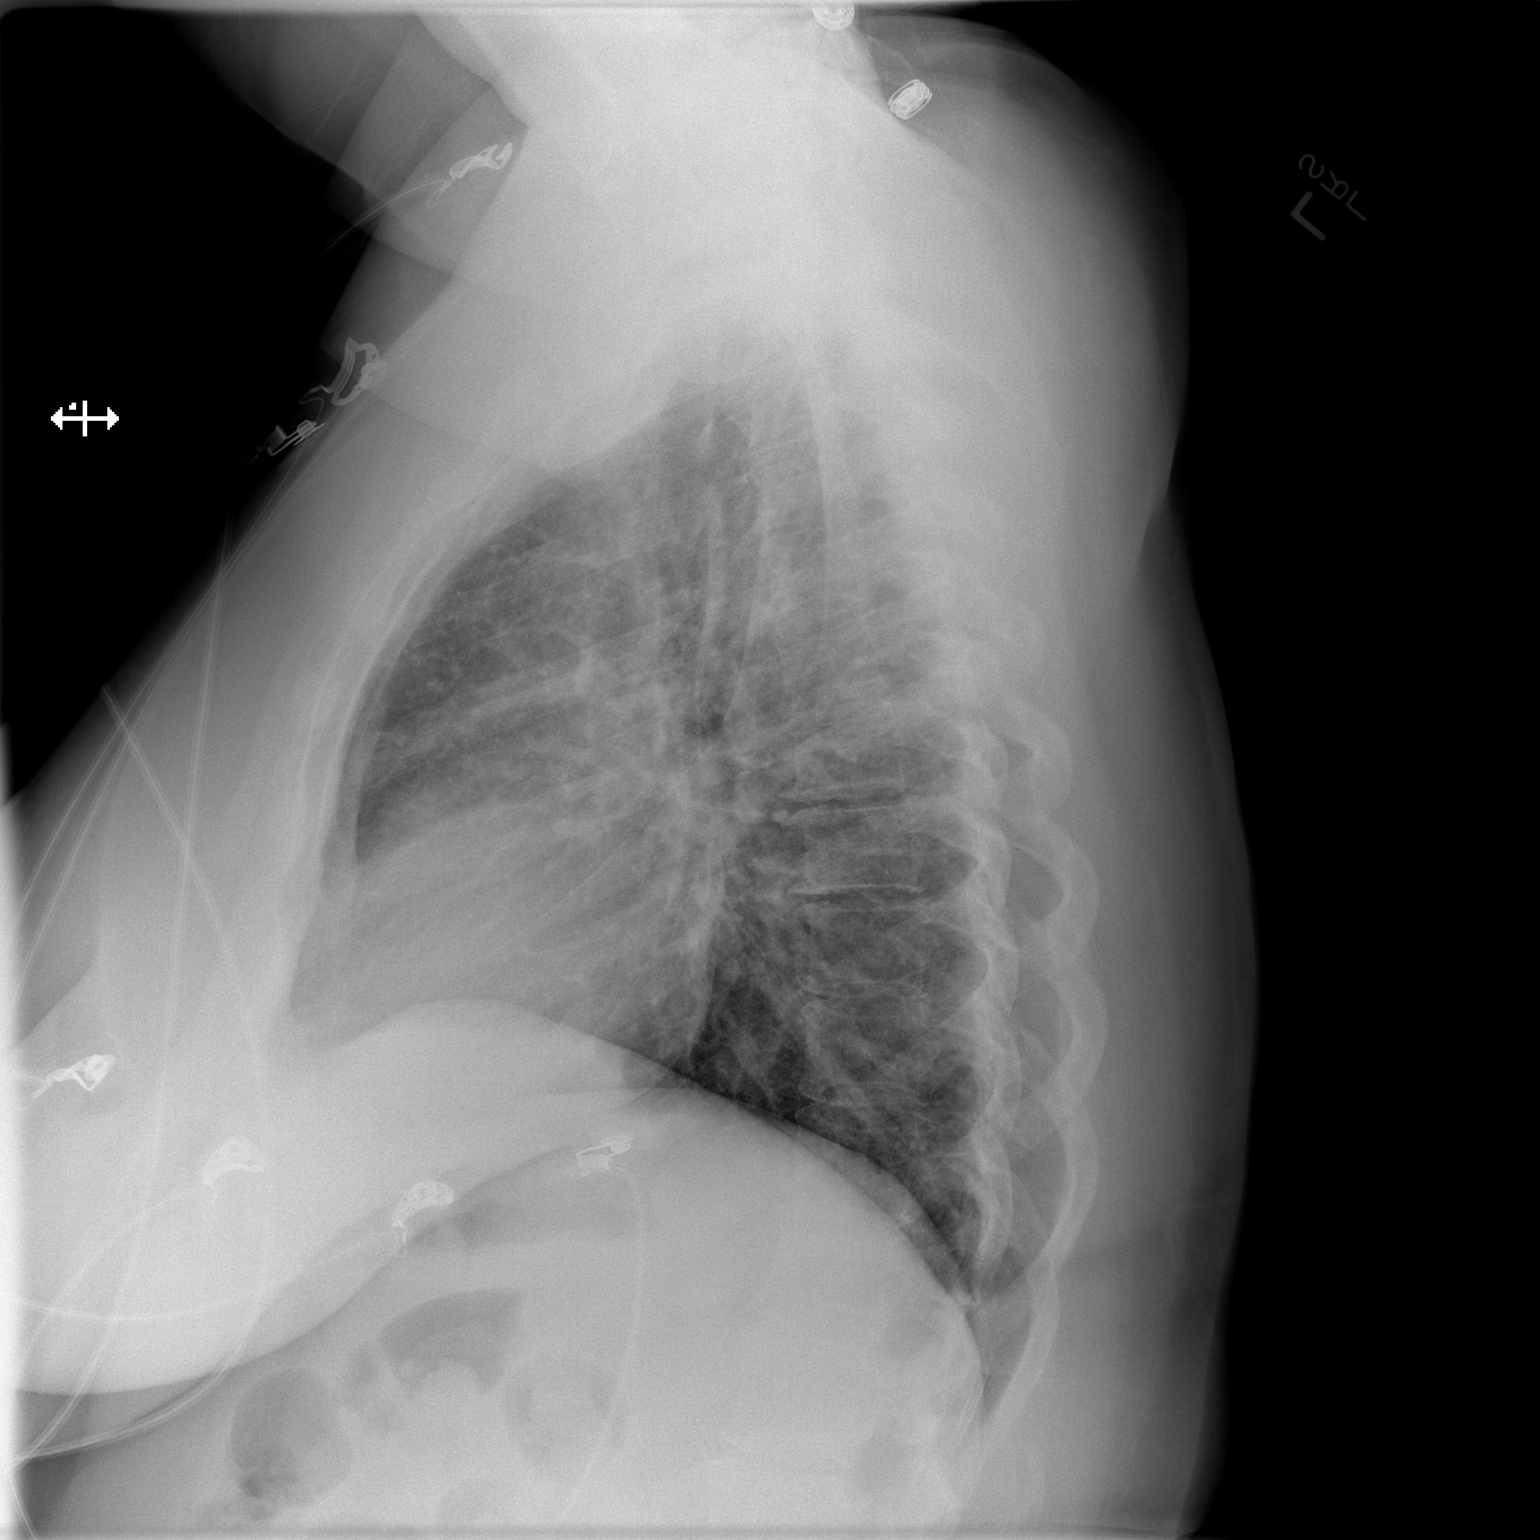

[2 of 2 positions shown; findings below may reference images not displayed]

FINDINGS: The lungs are well-aerated.  Mild vascular congestion is
noted.  There is no evidence of focal opacification, pleural
effusion or pneumothorax.

The heart is normal in size; the mediastinal contour is within
normal limits.  No acute osseous abnormalities are seen.
IMPRESSION: Mild vascular congestion noted, without significant pulmonary
edema.

## 2014-08-15 IMAGING — NM NM HEPATO W/GB/PHARM/[PERSON_NAME]
2 series · 12 of 12 positions shown · non-contrast
Comparison: None.

CLINICAL DATA: Upper abdominal pain

NUCLEAR MEDICINE HEPATOBILIARY WITH GB, PHARM AND QUAN
MEASURE/ejection fraction analysis
Radiopharmaceutical: Technetium 99m Choletec
Dose:  5.4 mCi
Route of administration: Intravenous
Views:  The anterior right upper quadrant

[Series 1: gb hepatobiliary scan · 4.75mm/px · 6 of 60 frames shown (1 of 2)]
[frame 6/60]
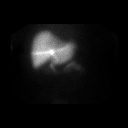
[frame 16/60]
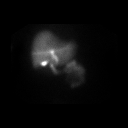
[frame 26/60]
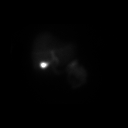
[frame 36/60]
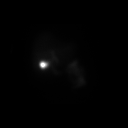
[frame 46/60]
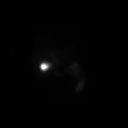
[frame 56/60]
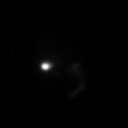

[Series 1: gb hepatobiliary scan · 4.75mm/px · 6 of 60 frames shown (2 of 2)]
[frame 6/60]
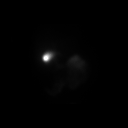
[frame 16/60]
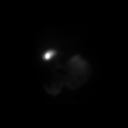
[frame 26/60]
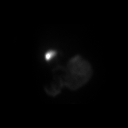
[frame 36/60]
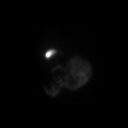
[frame 46/60]
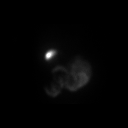
[frame 56/60]
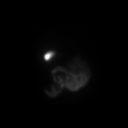

[12 of 12 positions shown; findings below may reference images not displayed]

FINDINGS: There is prompt visualization of gallbladder and small
bowel, indicating patency of the cystic and common bile ducts.

8 ounces of Ensure was administered orally with calculation of the
computer-generated ejection fraction of radiotracer from the
gallbladder.  The ejection fraction of radiotracer from the
gallbladder is normal at 54.8%, normal greater than 33% with the
oral agent.
IMPRESSION: Normal study.

## 2014-10-15 ENCOUNTER — Encounter: Payer: Self-pay | Admitting: Primary Care

## 2014-10-15 ENCOUNTER — Ambulatory Visit (INDEPENDENT_AMBULATORY_CARE_PROVIDER_SITE_OTHER): Payer: 59 | Admitting: Primary Care

## 2014-10-15 VITALS — BP 122/80 | HR 94 | Temp 98.2°F | Ht 62.5 in | Wt 248.8 lb

## 2014-10-15 DIAGNOSIS — R52 Pain, unspecified: Secondary | ICD-10-CM | POA: Diagnosis not present

## 2014-10-15 NOTE — Patient Instructions (Signed)
Your lungs sound clear and your ears do not appear to be infected. You may take Imodium if your diarrhea returns. Stay hydrated with water and start off eating bland foods. (Bananas, rice, apples, toast, etc.)  Keep me updated if your symptoms return. It was nice meeting you!

## 2014-10-15 NOTE — Progress Notes (Signed)
Pre visit review using our clinic review tool, if applicable. No additional management support is needed unless otherwise documented below in the visit note. 

## 2014-10-15 NOTE — Progress Notes (Signed)
Subjective:    Patient ID: [name], female    DOB: 01/12/1966, 49 y.o.   MRN: [id number]  HPI  [name] is a 49 year old female who presents today with multiple symptoms. She has an allergy to MSG which causes diarrhea, diaphoresis, and elevated BP. She will typically experience these symptoms for several hours after eating foods with MSG. She ate some foods Friday night that contained MSG. She woke up at 2am this past Saturday morning with those symptoms. Since Saturday she's experiencing chest congestion, sore throat, ear pain, stiff neck, headaches. She's taken benadryl, advil, sinus medication, and aspirin which helped reduce her headache. She was able to sleep throughout the night last night with Mucinex and sinus pills. Overall she's started feeling better. Denies diarrhea, nausea, fevers today.  Review of Systems  Constitutional: Positive for chills. Negative for fever.  HENT: Positive for congestion, sinus pressure and sore throat.   Respiratory: Positive for cough. Negative for shortness of breath.   Cardiovascular: Negative for chest pain.  Gastrointestinal: Positive for nausea, diarrhea and abdominal distention. Negative for vomiting and abdominal pain.  Musculoskeletal: Positive for myalgias.  Neurological: Positive for headaches.       Past Medical History  Diagnosis Date  . Hyperlipidemia   . Tobacco abuse   . Generalized headaches   . Obesity   . Uterine fibroid   . Ovarian cyst   . Hypercholesteremia   . Gallstones     recent dx after chest pain  . Shortness of breath     heavy exertion  . Eczema     History   Social History  . Marital Status: Single    Spouse Name: N/A  . Number of Children: 0  . Years of Education: N/A   Occupational History  . clerical [employer]  . Smoking status: Current Every Day Smoker -- 1.00 packs/day for 30 years    Types: Cigarettes  . Smokeless tobacco: Never Used  . Alcohol Use: Yes   Comment: 1 beer once a year  . Drug Use: No  . Sexual Activity: Not on file   Other Topics Concern  . Not on file   Social History Narrative    Past Surgical History  Procedure Laterality Date  . Endometrial surgery    . Cervical cone biopsy    . Abdominal hysterectomy  04/14/2012    Procedure: HYSTERECTOMY ABDOMINAL;  Surgeon: Cyril Mourning, MD;  Location: [facility];  Service: Gynecology;  Laterality: N/A;  . Salpingoophorectomy  04/14/2012    Procedure: SALPINGO OOPHORECTOMY;  Surgeon: Cyril Mourning, MD;  Location: [facility];  Service: Gynecology;  Laterality: Bilateral;    Family History  Problem Relation Age of Onset  . Heart disease Father   . Diabetes Father   . Skin cancer Father   . Arthritis Mother   . Diabetes Mother   . Diabetes Paternal Grandmother   . Colon cancer Neg Hx   . Esophageal cancer Neg Hx   . Stomach cancer Neg Hx   . Rectal cancer Neg Hx     No Known Allergies  Current Outpatient Prescriptions on File Prior to Visit  Medication Sig Dispense Refill  . ibuprofen (ADVIL,MOTRIN) 600 MG tablet Take 1 tablet (600 mg total) by mouth every 6 (six) hours as needed (mild pain). 60 tablet 0  . Halcinonide (HALOG) 0.1 % CREA Apply topically twice daily (Patient not taking: Reported on 10/15/2014) 216 g 0  .  triamcinolone cream (KENALOG) 0.1 % Apply 1 application topically 2 (two) times daily. (Patient not taking: Reported on 10/15/2014) 30 g 0   No current facility-administered medications on file prior to visit.    BP 122/80 mmHg  Pulse 94  Temp(Src) 98.2 F (36.8 C) (Oral)  Ht 5' 2.5" (1.588 m)  Wt 248 lb 12.8 oz (112.855 kg)  BMI 44.75 kg/m2  SpO2 97%  LMP 04/05/2012     Objective:   Physical Exam  Constitutional: She appears well-nourished.  HENT:  Right Ear: Tympanic membrane and ear canal normal.  Left Ear: Tympanic membrane and ear canal normal.  Nose: Right sinus exhibits no maxillary sinus tenderness and no frontal sinus tenderness.  Left sinus exhibits no maxillary sinus tenderness and no frontal sinus tenderness.  Mouth/Throat: No oropharyngeal exudate, posterior oropharyngeal edema or posterior oropharyngeal erythema.  Cardiovascular: Normal rate and regular rhythm.   Pulmonary/Chest: Effort normal and breath sounds normal.  Abdominal: Soft. Bowel sounds are normal. There is no tenderness.  Skin: Skin is warm and dry.          Assessment & Plan:  Suspect this is a combination of virus vs. Allergy to MSG. She's had no diarrhea, nausea, fevers, chills in the past 24 hours. Overall she's feeling improved and is able to eat and drink without vomiting or diarrhea. Push water. Start out with bland foods. Imodium if diarrhea returns.  Follow up if symptoms return.

## 2015-03-12 ENCOUNTER — Ambulatory Visit (INDEPENDENT_AMBULATORY_CARE_PROVIDER_SITE_OTHER): Payer: 59

## 2015-03-12 DIAGNOSIS — Z23 Encounter for immunization: Secondary | ICD-10-CM | POA: Diagnosis not present

## 2018-09-08 ENCOUNTER — Telehealth: Payer: Self-pay | Admitting: Family Medicine

## 2018-09-08 NOTE — Telephone Encounter (Signed)
Called and could not leave vm for patient. Calling to verity PCP. Patient has not been seen since 2020

## 2022-01-22 ENCOUNTER — Encounter: Payer: Self-pay | Admitting: Gastroenterology

## 2023-01-29 DIAGNOSIS — Z23 Encounter for immunization: Secondary | ICD-10-CM

## 2023-10-19 ENCOUNTER — Ambulatory Visit: Payer: Self-pay

## 2023-10-19 NOTE — Telephone Encounter (Signed)
 FYI Only or Action Required?: FYI only for provider.  Patient was last seen in primary care on n/a. Called Nurse Triage reporting Cyst. Symptoms began x 1.5 weeks. Interventions attempted: Nothing. Symptoms are: gradually worsening.  Triage Disposition: See PCP When Office is Open (Within 3 Days): referred patient to urgent: pt verbalized understanding & stated she would go.  Patient/caregiver understands and will follow disposition?: Yes    Copied from CRM [id number]. Topic: Clinical - Red Word Triage >> Oct 19, 2023  3:33 PM [place] E wrote: [facility] that prompted transfer to Nurse Triage: Cyst on left upper buttock. Patient stated it has been there for 1.5 weeks and it is swollen, painful and on/off bleeding. Reason for Disposition  Boil > 1/2 inch across (> 12 mm; larger than a marble)  Answer Assessment - Initial Assessment Questions 1. APPEARANCE of BOIL: What does the boil look like?      Small cyst on upper left buttock - swelling, painful 2. LOCATION: Where is the boil located?          Small cyst on upper left buttock 3. NUMBER: How many boils are there?      1 4. SIZE: How big is the boil? (e.g., inches, cm; compare to size of a coin or other object)     unknown 5. ONSET: When did the boil start?     1.5 weeks 6. PAIN: Is there any pain? If Yes, ask: How bad is the pain?   (Scale 1-10; or mild, moderate, severe)     9/10 7. FEVER: Do you have a fever? If Yes, ask: What is it, how was it measured, and when did it start?      no 8. SOURCE: Have you been around anyone with boils or other Staph infections? Have you ever had boils before?     unknown 9. OTHER SYMPTOMS: Do you have any other symptoms? (e.g., shaking chills, weakness, rash elsewhere on body)     Pain, swelling 10. PREGNANCY: Is there any chance you are pregnant? When was your last menstrual period?       N/a  Pt states this areas opens up, drains & goes down then it swells back up  in a couple of days.  Referred pt to urgent care for evaluation & tx due to no new patient appts available in next few days at a practice located in [place].  Protocols used: Boil (Skin Abscess)-A-AH

## 2024-02-03 ENCOUNTER — Encounter: Payer: Self-pay | Admitting: Family Medicine

## 2024-02-11 DIAGNOSIS — Z23 Encounter for immunization: Secondary | ICD-10-CM
